# Patient Record
Sex: Male | Born: 1960 | Race: White | Hispanic: No | Marital: Single | Smoking: Former smoker
Health system: Southern US, Community
[De-identification: ages and names within clinical notes are randomized; demographics above are authoritative.]

## PROBLEM LIST (undated history)

## (undated) DIAGNOSIS — K7460 Unspecified cirrhosis of liver: Secondary | ICD-10-CM

## (undated) DIAGNOSIS — B1920 Unspecified viral hepatitis C without hepatic coma: Secondary | ICD-10-CM

## (undated) DIAGNOSIS — J45909 Unspecified asthma, uncomplicated: Secondary | ICD-10-CM

## (undated) DIAGNOSIS — F419 Anxiety disorder, unspecified: Secondary | ICD-10-CM

## (undated) DIAGNOSIS — L409 Psoriasis, unspecified: Secondary | ICD-10-CM

## (undated) HISTORY — PX: LIVER BIOPSY: SHX301

## (undated) HISTORY — DX: Anxiety disorder, unspecified: F41.9

## (undated) HISTORY — DX: Unspecified cirrhosis of liver: K74.60

## (undated) HISTORY — DX: Psoriasis, unspecified: L40.9

## (undated) HISTORY — DX: Unspecified asthma, uncomplicated: J45.909

## (undated) HISTORY — DX: Unspecified viral hepatitis C without hepatic coma: B19.20

---

## 2001-09-04 HISTORY — PX: ANKLE SURGERY: SHX546

## 2007-09-13 ENCOUNTER — Encounter (INDEPENDENT_AMBULATORY_CARE_PROVIDER_SITE_OTHER): Payer: Self-pay | Admitting: Interventional Radiology

## 2007-09-13 ENCOUNTER — Ambulatory Visit (HOSPITAL_COMMUNITY): Admission: RE | Admit: 2007-09-13 | Discharge: 2007-09-13 | Payer: Self-pay | Admitting: Internal Medicine

## 2008-12-17 IMAGING — XA IR TRANSCATHETER BIOPSY
1 series · 3 of 3 positions shown · non-contrast
Comparison: none

CLINICAL DATA: Hepatitis C and low platelet count.
 ULTRASOUND GUIDANCE FOR VASCULAR ACCESS:
 FLUOROSCOPIC GUIDANCE FOR TRANSJUGULAR LIVER BIOPSY:
 FLUOROSCOPIC GUIDANCE FOR HEPATIC VENOGRAM:
 Procedure:  The right neck was prepped and draped in a sterile fashion and Lidocaine was utilized for local anesthesia. Under sonographic guidance, a micropuncture needle was inserted into the right IJ vein which was noted to be patent.  It was removed over an 018 wire which was up sized to Harola Kreatif. A 10 French sheath was advanced over the Biuro Detektywistyczne into the IVC.  A 5 French MPA catheter was advanced over the Bentson wire into the right hepatic vein.  Right hepatic venography was performed.  The MPA was removed over an Amplatz.  The 10 French sheath was advanced over the Amplatz into the right hepatic vein. The 7 French stiff sheath was advanced through the 10 French sheath into the right hepatic vein. Several passes with a core biopsy device were made.  The 7 and 10 French sheaths were removed and hemostasis was achieved with direct pressure with no complications.

[Series 1: run · 3 of 3 slices shown]
[im 1/3]
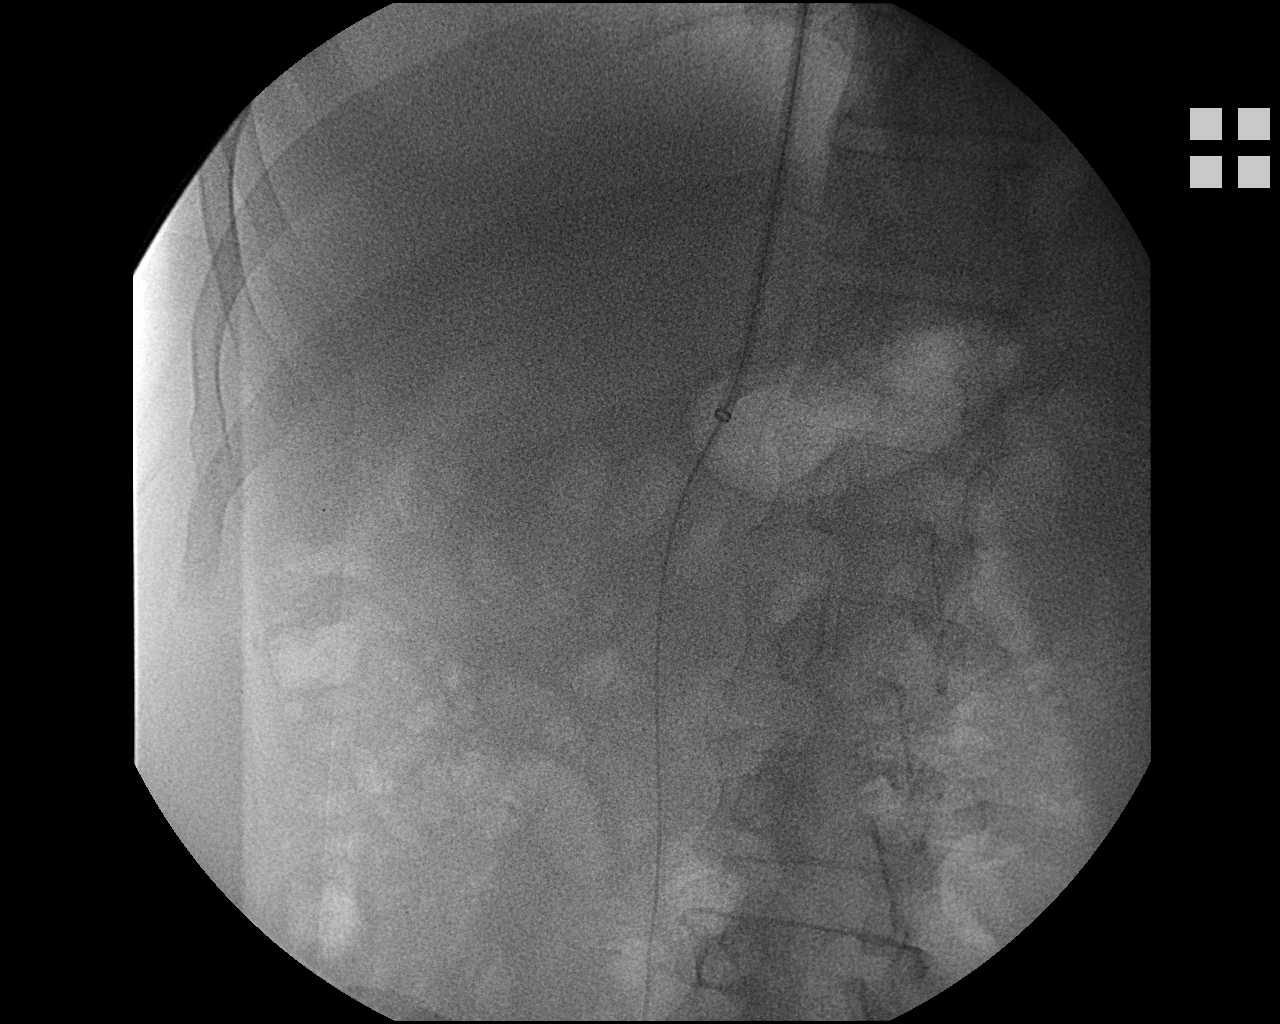
[im 2/3]
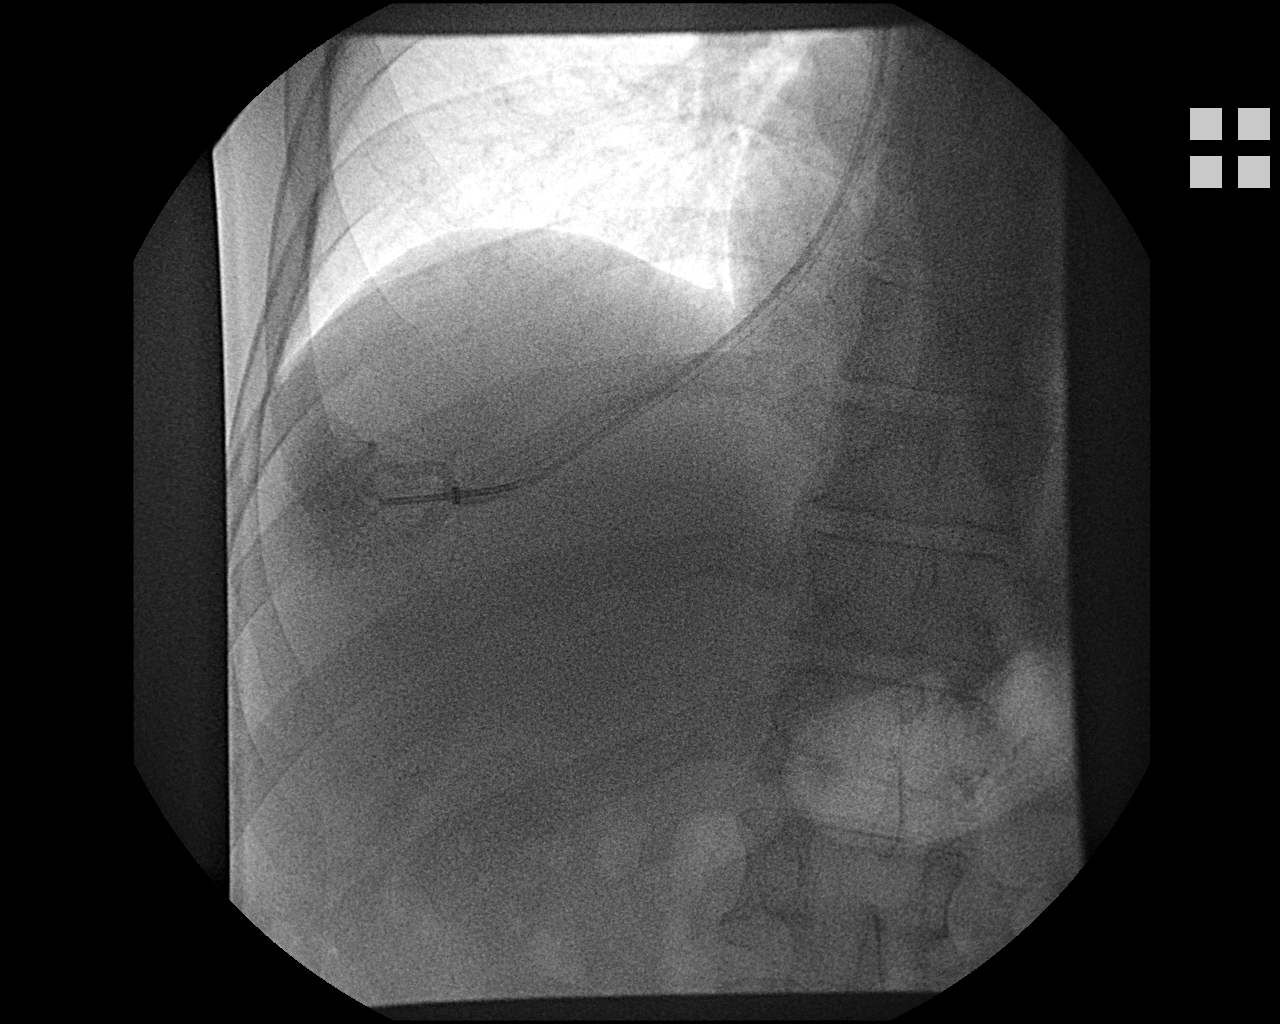
[im 3/3]
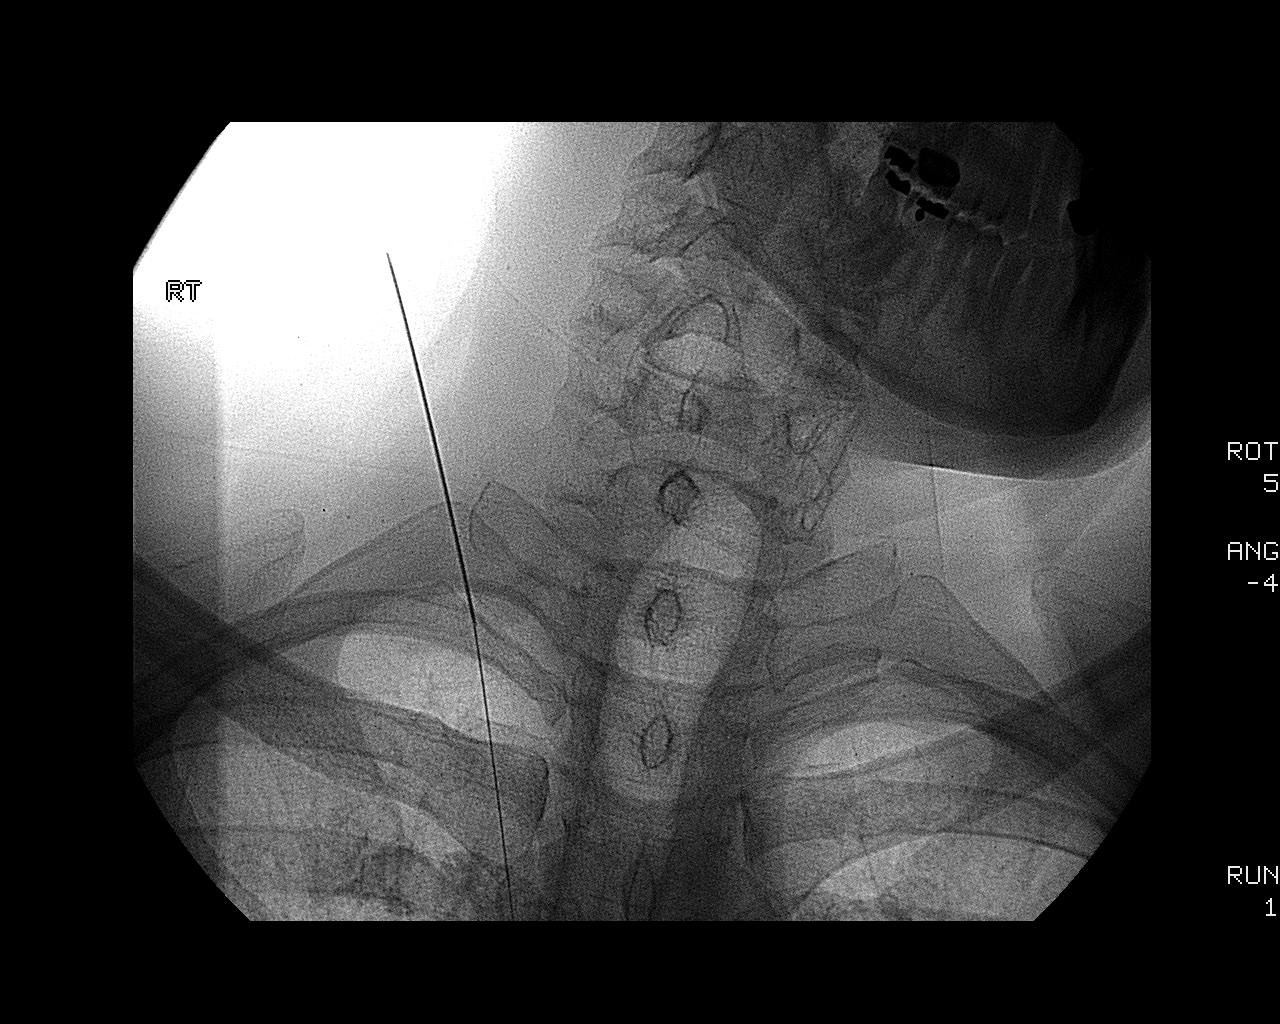

[3 of 3 positions shown; findings below may reference images not displayed]

FINDINGS: Hepatic venography demonstrates wide patency of the right hepatic vein.
IMPRESSION: 1.  Right hepatic vein is patent.
 2.  Transjugular liver core biopsy.

## 2011-05-25 LAB — BASIC METABOLIC PANEL
CO2: 29
Calcium: 9.5
GFR calc non Af Amer: >60
Glucose, Bld: 93
Potassium: 4.2
Sodium: 140

## 2011-05-25 LAB — CBC
Hemoglobin: 16.1
MCV: 91.6
RBC: 5.16
WBC: 6.6

## 2011-05-25 LAB — PROTIME-INR
INR: 1.1
Prothrombin Time: 14.1

## 2012-03-04 ENCOUNTER — Ambulatory Visit (INDEPENDENT_AMBULATORY_CARE_PROVIDER_SITE_OTHER): Payer: BC Managed Care – PPO | Admitting: Internal Medicine

## 2012-03-04 ENCOUNTER — Encounter (INDEPENDENT_AMBULATORY_CARE_PROVIDER_SITE_OTHER): Payer: Self-pay | Admitting: Internal Medicine

## 2012-03-04 VITALS — BP 118/78 | HR 80 | Temp 97.8°F | Resp 18 | Ht 69.0 in | Wt 178.9 lb

## 2012-03-04 DIAGNOSIS — L408 Other psoriasis: Secondary | ICD-10-CM

## 2012-03-04 DIAGNOSIS — L409 Psoriasis, unspecified: Secondary | ICD-10-CM | POA: Insufficient documentation

## 2012-03-04 DIAGNOSIS — B182 Chronic viral hepatitis C: Secondary | ICD-10-CM | POA: Insufficient documentation

## 2012-03-04 DIAGNOSIS — D6959 Other secondary thrombocytopenia: Secondary | ICD-10-CM

## 2012-03-04 DIAGNOSIS — K7460 Unspecified cirrhosis of liver: Secondary | ICD-10-CM

## 2012-03-04 NOTE — Patient Instructions (Signed)
Physician will contact you with results of blood work and ultrasound and further recommendations.

## 2012-03-04 NOTE — Progress Notes (Signed)
Presenting complaint;  Follow for chronic hepatitis C.  History of present illness;  Grade this 51 year old Caucasian male who has biopsy proven chronic hepatitis C. He underwent liver biopsy in January 2009 revealing stage III fibrosis. He received about 60 weeks of therapy with Pegasys and ribavirin. Viral count was negative at [facility] but he did not have SVR. Patient relocated to [place] and I have not seen him over 2 years. He states he did not drink alcohol for one more year but then in June 2012 he began to drink alcohol primarily beer. He realizes he was drinking excessive amounts. Several weeks ago he noted change in the color of his urine. Realizing that it was damaging his liver he quit drinking alcohol about a month ago. He denies abdominal pain nausea vomiting weakness or fatigue. He has good appetite. His weight has been stable. His bowels move daily. He denies melena or rectal bleeding. He has not have any problems with lower extremity edema.  Current Medications: No current outpatient prescriptions on file.  Past medical history; Psoriasis. Chronic hepatitis C stage III disease; liver biopsy January, 2009. He has been vaccinated for hepatitis A and B. History of asthma. Has not required any treatment for several years. Surgery for left ankle fracture in 2002. Allergies; NKA. Family history; Father drank too much alcohol and died at age 52 also be secondary to alcoholic hepatitis. Mother is in good health. Patient has one sister and half-sister and they're both in good health. Social history; He is never married and does not have any children. 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 He does not smoke cigarettes but dips snuff. He's been drinking beer for over 20 years. He quit for about 3 years and he was treated for hep C but started one year ago and quit again in one month ago.  Objective: Blood pressure 118/78, pulse 80, temperature 97.8 F  (36.6 C), temperature source Oral, resp. rate 18, height 5\' 9"  (1.753 m), weight 178 lb 14.4 oz (81.149 kg). Well-developed well-nourished very anxious Caucasian male who is in no acute distress. Asterixis absent. Conjunctiva is pink. Sclera is nonicteric Oropharyngeal mucosa is normal. No neck masses or thyromegaly noted. He has few spider angiomata over upper anterior chest Cardiac exam with regular rhythm normal S1 and S2. No murmur or gallop noted. Lungs are clear to auscultation. Abdomen is symmetrical. He has erythematous scaly rash around the umbilicus. Abdomen is soft and easily palpable spleen. Liver edge is indistinct span is 13-14 cm. Shifting dullness is absent. No LE edema or clubbing noted. He has a tremendous scaly rash over both legs but more so involving the right mid leg.    Assessment:  Chronic hepatitis C genotype 1 stage III status post antiviral therapy for 60 weeks ending in early part of 2010 without SVR. Clinically he does not have ascites. His disease needs to be restaged and determination made whether he can be retreated with 3 drugs. Chronic thrombocytopenia may determine whether or not he can be retreated with interferon-based therapy.   Plan:  Patient must abstain from drinking alcohol in order to improve his prognosis. Hepatobiliary ultrasound. CBC, comprehensive chemistry panel, INR and alpha-fetoprotein. Further recommendations to follow.

## 2012-03-05 ENCOUNTER — Encounter (INDEPENDENT_AMBULATORY_CARE_PROVIDER_SITE_OTHER): Payer: Self-pay | Admitting: *Deleted

## 2012-03-05 LAB — PROTIME-INR
INR: 1.1 (ref <1.50)
Prothrombin Time: 14.6 seconds (ref 11.6–15.2)

## 2012-03-05 LAB — COMPREHENSIVE METABOLIC PANEL
Albumin: 3.9 g/dL (ref 3.5–5.2)
BUN: 12 mg/dL (ref 6–23)
CO2: 25 mEq/L (ref 19–32)
Calcium: 9.2 mg/dL (ref 8.4–10.5)
Glucose, Bld: 73 mg/dL (ref 70–99)
Potassium: 3.6 mEq/L (ref 3.5–5.3)
Sodium: 139 mEq/L (ref 135–145)
Total Protein: 6.6 g/dL (ref 6.0–8.3)

## 2012-03-05 LAB — AFP TUMOR MARKER: AFP-Tumor Marker: 6.7 ng/mL (ref 0.0–8.0)

## 2012-03-05 LAB — CBC
HCT: 44.2 % (ref 39.0–52.0)
Hemoglobin: 15.7 g/dL (ref 13.0–17.0)
RBC: 4.84 MIL/uL (ref 4.22–5.81)

## 2012-03-05 NOTE — Telephone Encounter (Signed)
This encounter was created in error - please disregard.

## 2012-03-08 ENCOUNTER — Ambulatory Visit (HOSPITAL_COMMUNITY)
Admission: RE | Admit: 2012-03-08 | Discharge: 2012-03-08 | Disposition: A | Discharge status: Home / Self Care | Payer: BC Managed Care – PPO | Source: Ambulatory Visit | Attending: Internal Medicine | Admitting: Internal Medicine

## 2012-03-08 DIAGNOSIS — B182 Chronic viral hepatitis C: Secondary | ICD-10-CM | POA: Insufficient documentation

## 2012-03-08 DIAGNOSIS — R161 Splenomegaly, not elsewhere classified: Secondary | ICD-10-CM | POA: Insufficient documentation

## 2012-03-08 DIAGNOSIS — K7460 Unspecified cirrhosis of liver: Secondary | ICD-10-CM | POA: Insufficient documentation

## 2012-03-11 DIAGNOSIS — D6959 Other secondary thrombocytopenia: Secondary | ICD-10-CM | POA: Insufficient documentation

## 2012-03-14 ENCOUNTER — Telehealth (INDEPENDENT_AMBULATORY_CARE_PROVIDER_SITE_OTHER): Payer: Self-pay | Admitting: *Deleted

## 2012-03-14 DIAGNOSIS — B1920 Unspecified viral hepatitis C without hepatic coma: Secondary | ICD-10-CM

## 2012-03-14 NOTE — Telephone Encounter (Signed)
Per Dr.Rehman the patient will need to have labs in 6 months prior to his office visit. Labs are noted for 09-13-2012

## 2012-08-15 ENCOUNTER — Encounter (INDEPENDENT_AMBULATORY_CARE_PROVIDER_SITE_OTHER): Payer: Self-pay | Admitting: *Deleted

## 2012-08-15 ENCOUNTER — Telehealth (INDEPENDENT_AMBULATORY_CARE_PROVIDER_SITE_OTHER): Payer: Self-pay | Admitting: *Deleted

## 2012-08-15 DIAGNOSIS — B1920 Unspecified viral hepatitis C without hepatic coma: Secondary | ICD-10-CM

## 2012-08-15 NOTE — Telephone Encounter (Signed)
Lab order done 

## 2012-08-22 ENCOUNTER — Telehealth (INDEPENDENT_AMBULATORY_CARE_PROVIDER_SITE_OTHER): Payer: Self-pay | Admitting: *Deleted

## 2012-08-22 DIAGNOSIS — B1920 Unspecified viral hepatitis C without hepatic coma: Secondary | ICD-10-CM

## 2012-08-22 NOTE — Telephone Encounter (Signed)
Per Dr.Rehman we may add the ammonia level

## 2012-09-02 ENCOUNTER — Other Ambulatory Visit (INDEPENDENT_AMBULATORY_CARE_PROVIDER_SITE_OTHER): Payer: Self-pay | Admitting: Internal Medicine

## 2012-09-02 LAB — COMPREHENSIVE METABOLIC PANEL
Albumin: 4.4 g/dL (ref 3.5–5.2)
Alkaline Phosphatase: 91 U/L (ref 39–117)
BUN: 11 mg/dL (ref 6–23)
Creat: 0.87 mg/dL (ref 0.50–1.35)
Glucose, Bld: 82 mg/dL (ref 70–99)
Total Bilirubin: 1.9 mg/dL — ABNORMAL HIGH (ref 0.3–1.2)

## 2012-09-03 LAB — CBC WITH DIFFERENTIAL/PLATELET
Eosinophils Relative: 2 % (ref 0–5)
HCT: 46.2 % (ref 39.0–52.0)
Hemoglobin: 16 g/dL (ref 13.0–17.0)
Lymphocytes Relative: 31 % (ref 12–46)
Lymphs Abs: 1.2 10*3/uL (ref 0.7–4.0)
MCV: 88.2 fL (ref 78.0–100.0)
Monocytes Relative: 10 % (ref 3–12)
Platelets: 69 10*3/uL — ABNORMAL LOW (ref 150–400)
RBC: 5.24 MIL/uL (ref 4.22–5.81)
WBC: 4.3 10*3/uL (ref 4.0–10.5)

## 2012-09-17 ENCOUNTER — Ambulatory Visit (INDEPENDENT_AMBULATORY_CARE_PROVIDER_SITE_OTHER): Payer: BC Managed Care – PPO | Admitting: Internal Medicine

## 2012-11-25 ENCOUNTER — Ambulatory Visit (INDEPENDENT_AMBULATORY_CARE_PROVIDER_SITE_OTHER): Payer: BC Managed Care – PPO | Admitting: Internal Medicine

## 2013-01-14 ENCOUNTER — Ambulatory Visit (INDEPENDENT_AMBULATORY_CARE_PROVIDER_SITE_OTHER): Payer: BC Managed Care – PPO | Admitting: Internal Medicine

## 2013-01-14 ENCOUNTER — Encounter (INDEPENDENT_AMBULATORY_CARE_PROVIDER_SITE_OTHER): Payer: Self-pay | Admitting: Internal Medicine

## 2013-01-14 VITALS — BP 116/76 | HR 80 | Temp 97.4°F | Resp 18 | Ht 69.0 in | Wt 173.9 lb

## 2013-01-14 DIAGNOSIS — K7460 Unspecified cirrhosis of liver: Secondary | ICD-10-CM

## 2013-01-14 DIAGNOSIS — G4700 Insomnia, unspecified: Secondary | ICD-10-CM | POA: Insufficient documentation

## 2013-01-14 DIAGNOSIS — B182 Chronic viral hepatitis C: Secondary | ICD-10-CM

## 2013-01-14 MED ORDER — LORAZEPAM 1 MG PO TABS: 1.0000 mg | ORAL_TABLET | Freq: Every day | ORAL | Status: DC | PRN
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
## 2013-01-14 NOTE — Patient Instructions (Signed)
Can take lorazepam 1 mg by mouth at bedtime as needed. Physician will contact her with results of blood work

## 2013-01-15 LAB — CBC
HCT: 46.4 % (ref 39.0–52.0)
Hemoglobin: 16.3 g/dL (ref 13.0–17.0)
MCHC: 35.1 g/dL (ref 30.0–36.0)
WBC: 5.2 10*3/uL (ref 4.0–10.5)

## 2013-01-15 LAB — COMPREHENSIVE METABOLIC PANEL
Albumin: 4.4 g/dL (ref 3.5–5.2)
BUN: 14 mg/dL (ref 6–23)
Calcium: 9.8 mg/dL (ref 8.4–10.5)
Chloride: 105 mEq/L (ref 96–112)
Glucose, Bld: 77 mg/dL (ref 70–99)
Potassium: 3.7 mEq/L (ref 3.5–5.3)

## 2013-01-15 LAB — PROTIME-INR: INR: 1.16 (ref <1.50)

## 2013-01-15 LAB — AFP TUMOR MARKER: AFP-Tumor Marker: 5.8 ng/mL (ref 0.0–8.0)

## 2013-04-15 ENCOUNTER — Encounter (INDEPENDENT_AMBULATORY_CARE_PROVIDER_SITE_OTHER): Payer: Self-pay | Admitting: Internal Medicine

## 2013-04-15 ENCOUNTER — Ambulatory Visit (INDEPENDENT_AMBULATORY_CARE_PROVIDER_SITE_OTHER): Payer: BC Managed Care – PPO | Admitting: Internal Medicine

## 2013-04-15 VITALS — BP 114/68 | HR 74 | Temp 99.4°F | Resp 20 | Ht 69.0 in | Wt 175.9 lb

## 2013-04-15 DIAGNOSIS — G4700 Insomnia, unspecified: Secondary | ICD-10-CM

## 2013-04-15 DIAGNOSIS — K7460 Unspecified cirrhosis of liver: Secondary | ICD-10-CM

## 2013-04-15 DIAGNOSIS — B182 Chronic viral hepatitis C: Secondary | ICD-10-CM

## 2013-04-15 MED ORDER — LORAZEPAM 1 MG PO TABS: 1.5000 mg | ORAL_TABLET | Freq: Every day | ORAL | Status: DC | PRN

## 2013-04-15 NOTE — Progress Notes (Signed)
Presenting complaint;  Followup for hepatitis C and insomnia.  Subjective:  Patient is 52 year old Caucasian male who has chronic hepatitis C with cirrhosis who presents for scheduled visit. He was last seen about 3 months ago. He states he he has not had an alcohol since his last visit. 1 mg of trazodone does not help at one and a half milligrams does help him sleep. His appetite is fair. His weight has been stable. He is not have any problems with abdominal distention or fluid retention in his lower extremities. He denies melena or rectal bleeding. He is interested in having screening colonoscopy.  Current Medications: Current Outpatient Prescriptions  Medication Sig Dispense Refill  . LORazepam (ATIVAN) 1 MG tablet Take 1 tablet (1 mg total) by mouth daily as needed for anxiety.  30 tablet  1   No current facility-administered medications for this visit.     Objective: Blood pressure 114/68, pulse 74, temperature 99.4 F (37.4 C), temperature source Oral, resp. rate 20, height 5\' 9"  (1.753 m), weight 175 lb 14.4 oz (79.788 kg). Patient is alert and does not have asterixis. Conjunctiva is pink. Sclera is nonicteric Oropharyngeal mucosa is normal. No neck masses or thyromegaly noted. Cardiac exam with regular rhythm normal S1 and S2. No murmur or gallop noted. Lungs are clear to auscultation. Abdomen abdomen is soft with easily palpable spleen. Liver edge is also palpable below RCM margin is firm and nontender.  No LE edema or clubbing noted.  Labs/studies Results: Records from [facility] reviewed. He had abdominopelvic CT as well as chest CT on 06/08/2012. He has splenomegaly no focal abnormalities to liver. Lab studies from 01/14/2013 WBC 5.2, H&H 16.3 and 46.4 and platelet count 64K AFP 5.8 INR 1.16 Electrolytes within normal limits. Glucose 77, BUN 14, creatinine 0.84 Bilirubin 1.2 AP 104 AST 112 and ALT 119 Albumin 4.4,      Assessment:  #1.  Chronic hepatitis C with cirrhosis. He has failed treatment with Pegasys and ribavirin. He would be candidate for retreatment of newer agents before too long. He must continue to abstain from drinking alcohol. #2. Insomnia. .   Plan:  Ultrasound for HCC screening. Screening colonoscopy to be scheduled. Lorazepam 1.5 mg by mouth each bedtime. Office visit in 6 months or earlier depending on availability of newer agents to treat hepatitis C.

## 2013-04-15 NOTE — Patient Instructions (Signed)
-  Screening colonoscopy to be scheduled

## 2013-04-18 ENCOUNTER — Ambulatory Visit (HOSPITAL_COMMUNITY): Payer: BC Managed Care – PPO

## 2013-04-24 ENCOUNTER — Encounter (INDEPENDENT_AMBULATORY_CARE_PROVIDER_SITE_OTHER): Payer: Self-pay

## 2013-06-12 IMAGING — US US ABDOMEN COMPLETE
1 series · 14 of 25 positions shown · non-contrast
Comparison: Ultrasound dated 04/22/2008

CLINICAL DATA: Hepatitis C.

COMPLETE ABDOMINAL ULTRASOUND

[Series 1: us abdomen complete · 0.25mm/px · 14 of 93 slices shown]
[im 1/93]
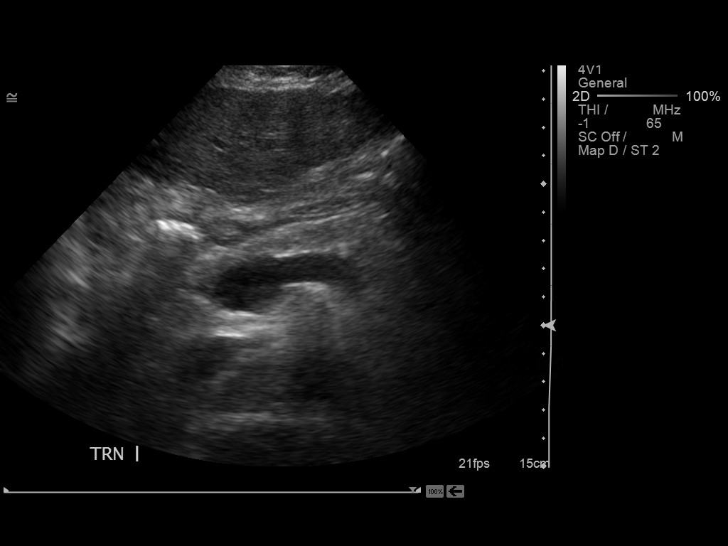
[im 8/93]
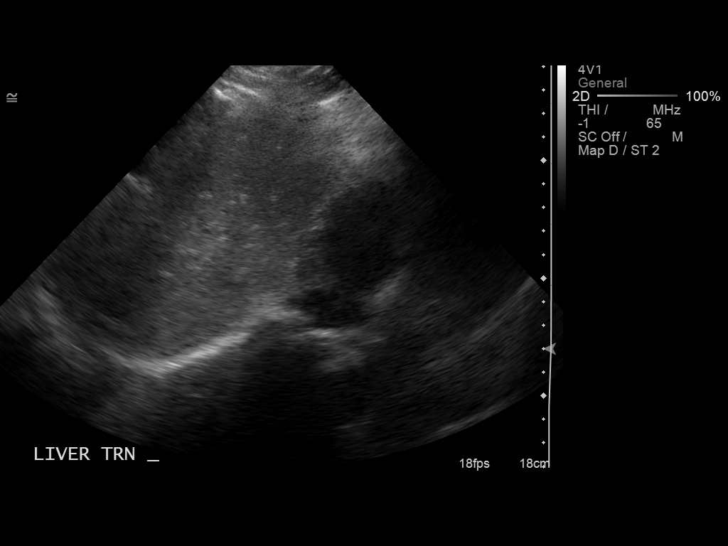
[im 16/93]
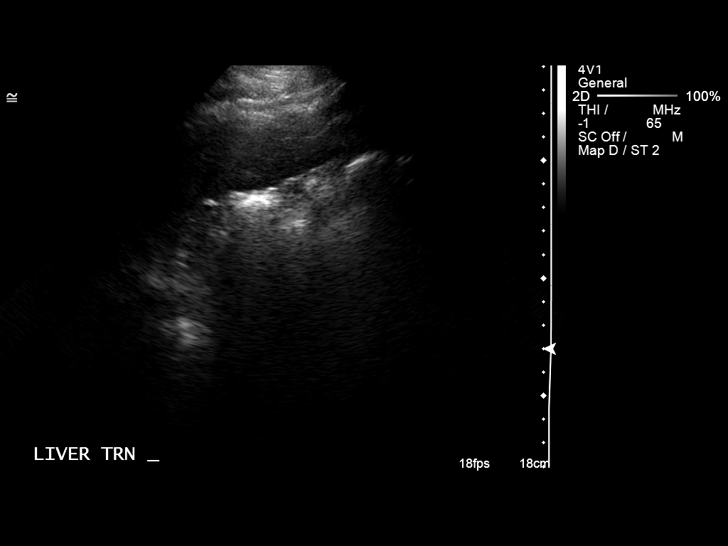
[im 24/93]
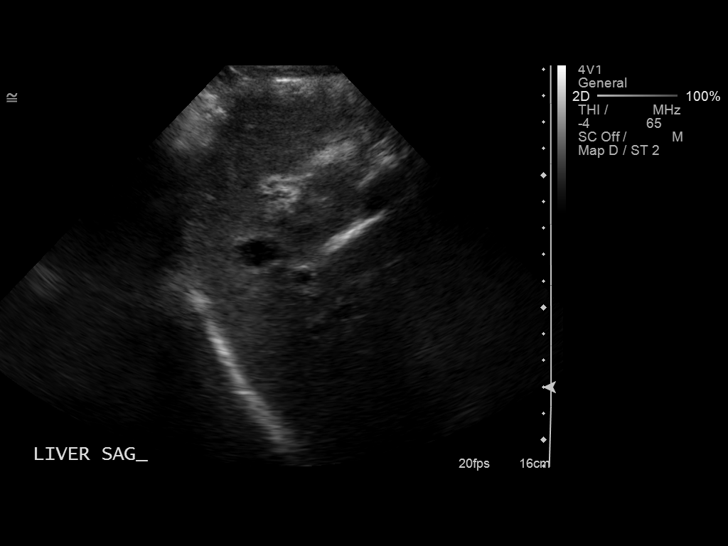
[im 31/93]
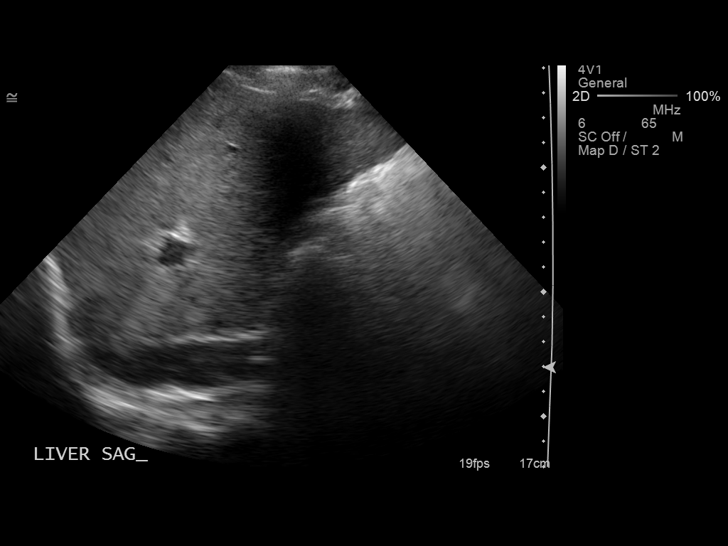
[im 35/93]
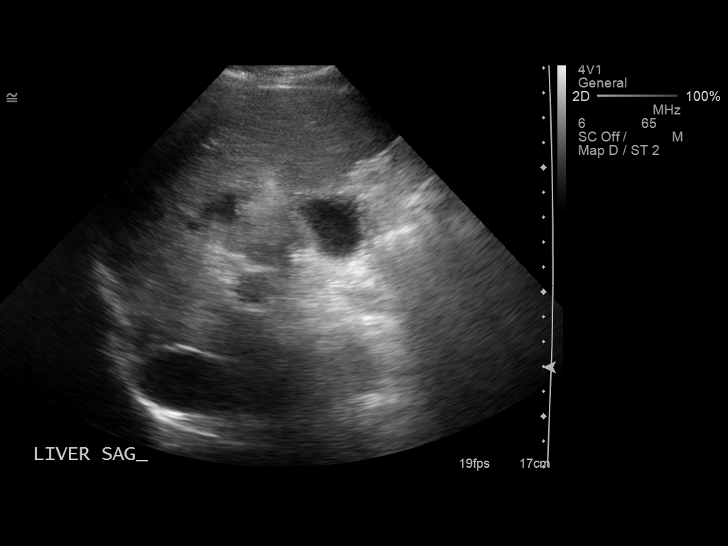
[im 43/93]
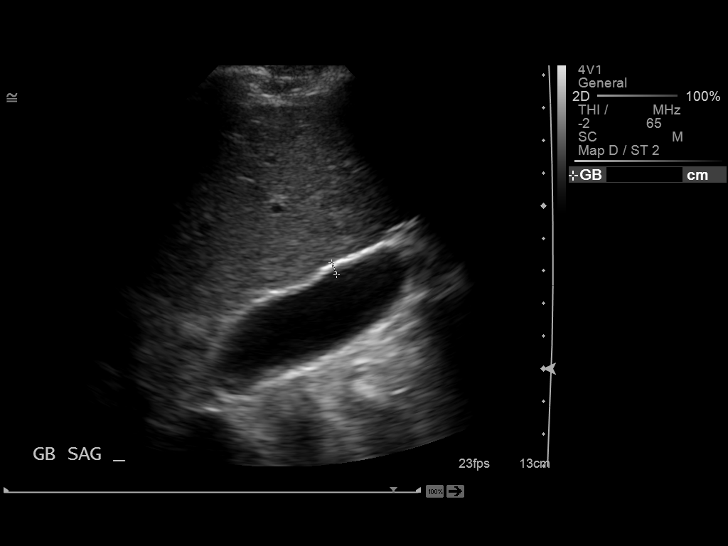
[im 50/93]
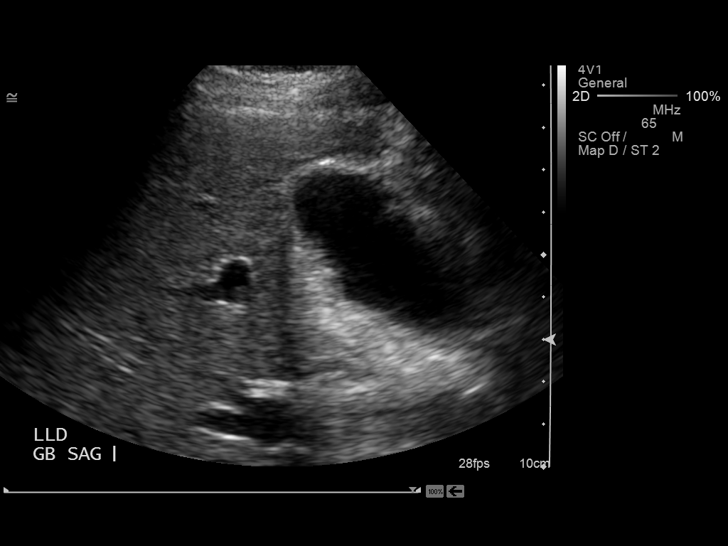
[im 58/93]
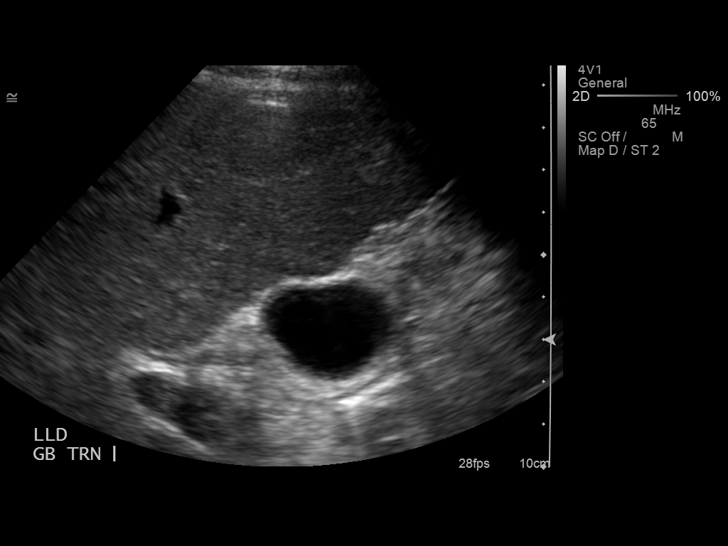
[im 62/93]
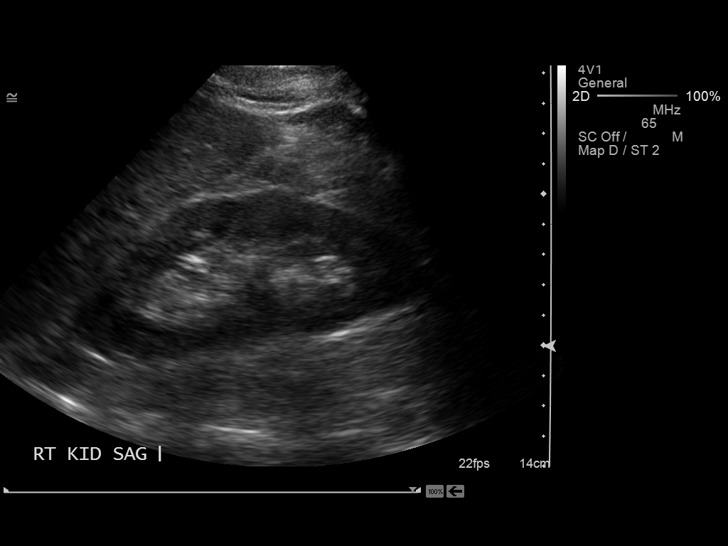
[im 70/93]
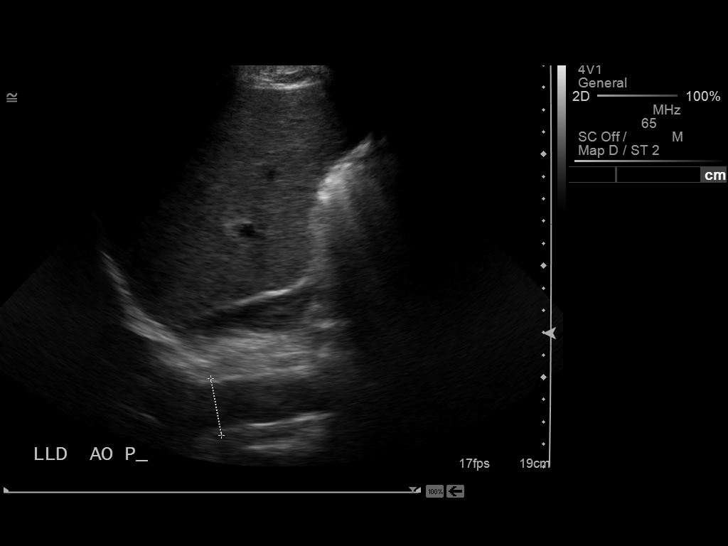
[im 77/93]
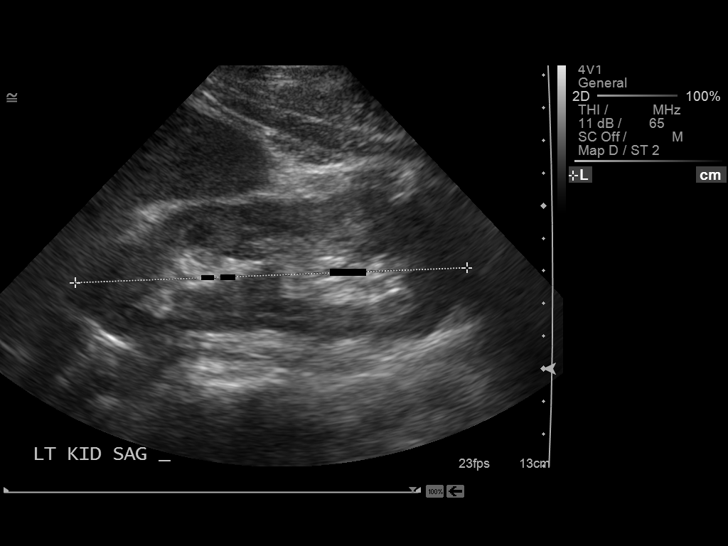
[im 85/93]
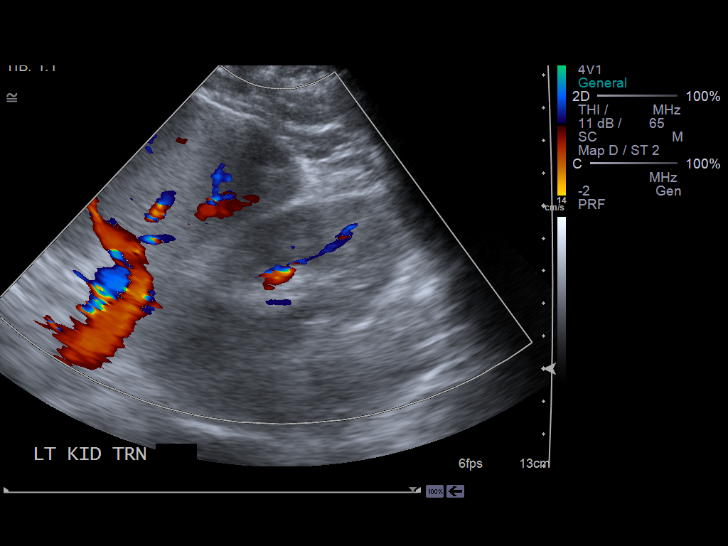
[im 93/93]
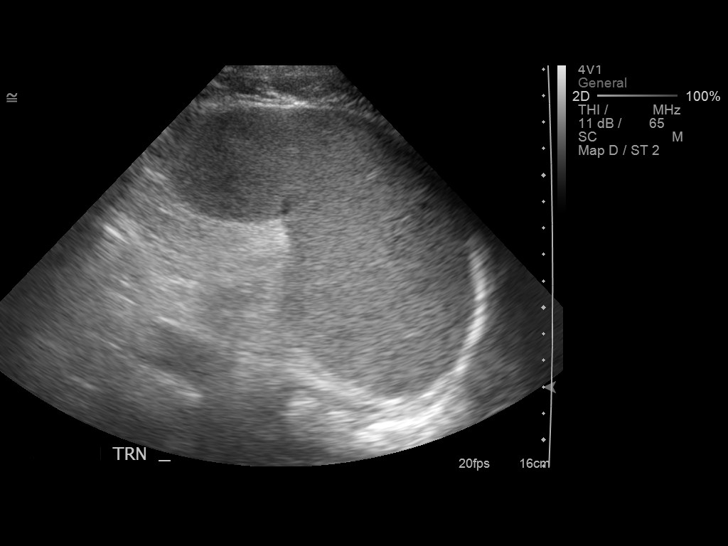

[14 of 25 positions shown; findings below may reference images not displayed]

FINDINGS: Gallbladder:  No gallstones, gallbladder wall thickening, or
pericholecystic fluid. Negative sonographic Murphy's sign.

Common bile duct:  Normal.  5 mm in diameter.

Liver:  No focal lesions in the liver.  Echotexture is slightly
coarse, nonspecific.

IVC:  Normal.

Pancreas:  Normal.

Spleen:  Splenomegaly.  Length is 10.9 cm. Volume is 485 cc.

Right Kidney:  Normal.  11.9 cm in length.

Left Kidney:  Normal.  12.0 cm in length.

Abdominal aorta:  Normal.  2.6 cm maximal diameter.
IMPRESSION: Slight splenomegaly.  No significant abnormality of the liver
parenchyma.

## 2013-08-20 ENCOUNTER — Other Ambulatory Visit (INDEPENDENT_AMBULATORY_CARE_PROVIDER_SITE_OTHER): Payer: Self-pay | Admitting: Internal Medicine

## 2013-10-21 ENCOUNTER — Ambulatory Visit (INDEPENDENT_AMBULATORY_CARE_PROVIDER_SITE_OTHER): Payer: BC Managed Care – PPO | Admitting: Internal Medicine

## 2013-11-26 ENCOUNTER — Other Ambulatory Visit (INDEPENDENT_AMBULATORY_CARE_PROVIDER_SITE_OTHER): Payer: Self-pay | Admitting: Internal Medicine

## 2013-11-26 ENCOUNTER — Ambulatory Visit (INDEPENDENT_AMBULATORY_CARE_PROVIDER_SITE_OTHER): Payer: BC Managed Care – PPO | Admitting: Internal Medicine

## 2013-11-26 ENCOUNTER — Encounter (INDEPENDENT_AMBULATORY_CARE_PROVIDER_SITE_OTHER): Payer: Self-pay | Admitting: Internal Medicine

## 2013-11-26 VITALS — BP 110/70 | HR 76 | Temp 98.3°F | Resp 20 | Ht 69.0 in | Wt 180.9 lb

## 2013-11-26 DIAGNOSIS — G4700 Insomnia, unspecified: Secondary | ICD-10-CM

## 2013-11-26 DIAGNOSIS — K7460 Unspecified cirrhosis of liver: Secondary | ICD-10-CM

## 2013-11-26 DIAGNOSIS — B182 Chronic viral hepatitis C: Secondary | ICD-10-CM

## 2013-11-26 LAB — CBC
HEMATOCRIT: 45.1 % (ref 39.0–52.0)
HEMOGLOBIN: 16.1 g/dL (ref 13.0–17.0)
MCH: 31.8 pg (ref 26.0–34.0)
MCHC: 35.7 g/dL (ref 30.0–36.0)
MCV: 89.1 fL (ref 78.0–100.0)
Platelets: 55 10*3/uL — ABNORMAL LOW (ref 150–400)
RBC: 5.06 MIL/uL (ref 4.22–5.81)
RDW: 13.1 % (ref 11.5–15.5)
WBC: 4.2 10*3/uL (ref 4.0–10.5)

## 2013-11-26 MED ORDER — LORAZEPAM 1 MG PO TABS: ORAL_TABLET | ORAL | Status: DC

## 2013-11-26 NOTE — Progress Notes (Signed)
Presenting complaint;  Follow for chronic hepatitis C.  Subjective:  Patient is 53 year old Caucasian male who has chronic hepatitis C genotype 1A and insomnia was here for scheduled visit. He was last seen in August 2014. He states he has not had any alcohol in almost one year. He feels well. He has good appetite. He skin 5 pounds since last visit. He denies abdominal pain melena or rectal bleeding. On his last visit he was advised to undergo screening colonoscopy but he has wandered away. He was also advised ultrasound primarily for screening but his co-pay was too large and he was unable to afford it. He is anxious to be retreated. He would like to get new prescription for lorazepam which she takes for insomnia and he states current dose is helping him.   Current Medications: Outpatient Encounter Prescriptions as of 11/26/2013  Medication Sig  . LORazepam (ATIVAN) 1 MG tablet TAKE 1 1/2 TABLETS BY MOUTH EVERY DAY AS NEEDED FOR ANXIETY     Objective: Blood pressure 110/70, pulse 76, temperature 98.3 F (36.8 C), temperature source Oral, resp. rate 20, height 5\' 9"  (1.753 m), weight 180 lb 14.4 oz (82.056 kg). Patient is alert and in no acute distress. He does not have asterixis. Conjunctiva is pink. Sclera is nonicteric Oropharyngeal mucosa is normal. No neck masses or thyromegaly noted. Cardiac exam with regular rhythm normal S1 and S2. No murmur or gallop noted. Lungs are clear to auscultation. Abdomen symmetrical and soft without tenderness. Spleen tip is palpable. Liver edge is also palpable below right costal margin. It is somewhat firm but not tender.  He has skin rash involving skin at the umbilicus. No LE edema or clubbing noted.    Assessment:  #1. Chronic hepatitis C genotype 1a. He had a liver biopsy in January 2009 revealed stage III disease. He failed therapy with Pegasys and [place]. 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 Will try  to document this with elastography. #2. Chronic insomnia. He is getting some relief with lorazepam.  Plan;  Patient will go to lab for CBC, comprehensive chemistry panel and AFP. Will also obtain HCVRNA by PCR quantitative. Upper abdominal ultrasound along with elastography. As soon as re-evaluation is complete will send request to his insurance. New prescription given for lorazepam 1 mg tablet 45 pills with 2 refills. Office visit in 3 months. Will schedule screening colonoscopy when he is ready.

## 2013-11-26 NOTE — Patient Instructions (Signed)
Physician will contact you with results of ultrasound and blood work

## 2013-11-27 ENCOUNTER — Telehealth (INDEPENDENT_AMBULATORY_CARE_PROVIDER_SITE_OTHER): Payer: Self-pay | Admitting: *Deleted

## 2013-11-27 DIAGNOSIS — B1920 Unspecified viral hepatitis C without hepatic coma: Secondary | ICD-10-CM

## 2013-11-27 LAB — COMPREHENSIVE METABOLIC PANEL
ALBUMIN: 3.9 g/dL (ref 3.5–5.2)
ALT: 122 U/L — ABNORMAL HIGH (ref 0–53)
AST: 107 U/L — AB (ref 0–37)
Alkaline Phosphatase: 96 U/L (ref 39–117)
BUN: 11 mg/dL (ref 6–23)
CALCIUM: 9.3 mg/dL (ref 8.4–10.5)
CHLORIDE: 104 meq/L (ref 96–112)
CO2: 27 meq/L (ref 19–32)
CREATININE: 0.95 mg/dL (ref 0.50–1.35)
GLUCOSE: 75 mg/dL (ref 70–99)
POTASSIUM: 4.4 meq/L (ref 3.5–5.3)
Sodium: 137 mEq/L (ref 135–145)
Total Bilirubin: 1.4 mg/dL — ABNORMAL HIGH (ref 0.2–1.2)
Total Protein: 6.8 g/dL (ref 6.0–8.3)

## 2013-11-27 LAB — HEPATITIS C RNA QUANTITATIVE
HCV Quantitative Log: 5.26 {Log} — ABNORMAL HIGH (ref <1.18)
HCV Quantitative: 182553 IU/mL — ABNORMAL HIGH (ref <15)

## 2013-11-27 LAB — AFP TUMOR MARKER: AFP TUMOR MARKER: 7 ng/mL (ref 0.0–8.0)

## 2013-11-27 NOTE — Telephone Encounter (Signed)
Per Dr.Rehman the patient will need to have labs drawn. 

## 2013-12-01 ENCOUNTER — Ambulatory Visit (HOSPITAL_COMMUNITY)
Admission: RE | Admit: 2013-12-01 | Discharge: 2013-12-01 | Disposition: A | Discharge status: Home / Self Care | Payer: BC Managed Care – PPO | Source: Ambulatory Visit | Attending: Internal Medicine | Admitting: Internal Medicine

## 2013-12-01 DIAGNOSIS — R161 Splenomegaly, not elsewhere classified: Secondary | ICD-10-CM | POA: Insufficient documentation

## 2013-12-01 DIAGNOSIS — B182 Chronic viral hepatitis C: Secondary | ICD-10-CM

## 2013-12-01 DIAGNOSIS — B1920 Unspecified viral hepatitis C without hepatic coma: Secondary | ICD-10-CM | POA: Insufficient documentation

## 2013-12-01 DIAGNOSIS — K7460 Unspecified cirrhosis of liver: Secondary | ICD-10-CM | POA: Insufficient documentation

## 2014-02-09 ENCOUNTER — Telehealth (INDEPENDENT_AMBULATORY_CARE_PROVIDER_SITE_OTHER): Payer: Self-pay | Admitting: *Deleted

## 2014-02-09 NOTE — Telephone Encounter (Signed)
Message left on voice mail Friday 12:33  Calling to check on medication for his treatment  Please call her back   [phone]

## 2014-02-10 ENCOUNTER — Encounter (INDEPENDENT_AMBULATORY_CARE_PROVIDER_SITE_OTHER): Payer: Self-pay

## 2014-02-11 NOTE — Telephone Encounter (Signed)
I have called the patient 's mother after rec'ving a letter from the patient giving [place] permission to do so. Prior to calling her, I called [facility]. They informed me today that they had not recd' anything from us,and that the number I had faxed, and recd' a successful transmittal, they are having problems with that particular fax line. This was all sent on May 4,2015.  They went ahead and sent to me another form to fill out. This has been done and we have requested that this be considered urgent. I have left this detailed message for his Mom and ask that if she had any further questions to please call our office back. Otherwise, we would call them back  as we heard back from [facility].

## 2014-02-20 ENCOUNTER — Telehealth (INDEPENDENT_AMBULATORY_CARE_PROVIDER_SITE_OTHER): Payer: Self-pay | Admitting: *Deleted

## 2014-02-20 NOTE — Telephone Encounter (Signed)
Patient was called and a message left. I explained that the Heloise PurpuraViekira was denied and that Dr.Rehman had ask that we call in Harvoni. I talked with Jaz with [facility] and they are going to fax a new PA request form for the Harvoni. If approved I will need to call the warehouse number to insure that the correct mail order Pharmacy is called @ [phone].

## 2014-03-10 ENCOUNTER — Ambulatory Visit (INDEPENDENT_AMBULATORY_CARE_PROVIDER_SITE_OTHER): Payer: BC Managed Care – PPO | Admitting: Internal Medicine

## 2014-03-12 ENCOUNTER — Telehealth (INDEPENDENT_AMBULATORY_CARE_PROVIDER_SITE_OTHER): Payer: Self-pay | Admitting: *Deleted

## 2014-03-12 ENCOUNTER — Encounter (INDEPENDENT_AMBULATORY_CARE_PROVIDER_SITE_OTHER): Payer: Self-pay | Admitting: *Deleted

## 2014-03-12 NOTE — Telephone Encounter (Signed)
Noted. Patient has not rec' his medication for the treatment of Hep C. This is why he was on for this week.

## 2014-03-12 NOTE — Telephone Encounter (Signed)
Jerome LitesGregory No Showed for his apt on 03/10/14 with Dr. Karilyn Cotaehman. A letter has been sent out to the patient.

## 2014-03-19 ENCOUNTER — Telehealth (INDEPENDENT_AMBULATORY_CARE_PROVIDER_SITE_OTHER): Payer: Self-pay | Admitting: *Deleted

## 2014-03-19 NOTE — Telephone Encounter (Signed)
I have called and talked with Jerome Parsons about the status of the patient's Harvoni. I explained that I had faxed, faxed and faxed to [facility] all the information that was requested for a PA. We have heard nothing regarding this matter , she is planning call me on 03/24/14 @ 8 am, so that we may do a conference call with [facility]. Meanwhile, I am going to refax the information to [facility].

## 2014-03-19 NOTE — Telephone Encounter (Signed)
noted 

## 2014-03-24 ENCOUNTER — Telehealth (INDEPENDENT_AMBULATORY_CARE_PROVIDER_SITE_OTHER): Payer: Self-pay | Admitting: *Deleted

## 2014-03-24 NOTE — Telephone Encounter (Signed)
Patient has been approved for Harvoni #28 for 12 weeks of therapy. We rec'd the paper work and I also called the patient's insurance to verify again and also the pharmacy that is required for the patient to use. He has to use [facility]. A prescription was called to them/ Shanise : Harvoni  - Take 1 by mouth daily #28 with 2 refills. She states that they will expedite since the patient has been waiting several months to get the medication. His mother's telephone number was provided, as she is the support person. They will cal her to make the arrangements of the medication being mailed out. I ask that she call our office when ir arrived , or prior to patient taking.

## 2014-04-08 ENCOUNTER — Telehealth (INDEPENDENT_AMBULATORY_CARE_PROVIDER_SITE_OTHER): Payer: Self-pay | Admitting: *Deleted

## 2014-04-08 NOTE — Telephone Encounter (Signed)
Jerome Parsons would like to speak with Jerome Parsons before he starts his Hep C medication that was received on 04/07/14. His return phone number is [phone].

## 2014-04-08 NOTE — Telephone Encounter (Signed)
I have talked with the patient. He plans to start the [place] tonight,he will call and leave a voice mail for me if he does. Per Dr.Rehman the patient will need to have a office visit 5 weeks after he starts the medication , and the following labs : CBC, C-Met, Hepatitis C RNA Quant 4 weeks after starting the medication.

## 2014-04-09 ENCOUNTER — Telehealth (INDEPENDENT_AMBULATORY_CARE_PROVIDER_SITE_OTHER): Payer: Self-pay | Admitting: *Deleted

## 2014-04-09 ENCOUNTER — Other Ambulatory Visit (INDEPENDENT_AMBULATORY_CARE_PROVIDER_SITE_OTHER): Payer: Self-pay | Admitting: *Deleted

## 2014-04-09 DIAGNOSIS — B182 Chronic viral hepatitis C: Secondary | ICD-10-CM

## 2014-04-09 NOTE — Telephone Encounter (Signed)
Per Dr.Rehman the patient will need to have labs drawn in 4 weeks post starting Harvoni. Labs noted for 05-07-14.  Patient will need office visit 5 weeks with Dr.Rehman post starting Harvoni. Patient started 04/08/14. Appointment should be the week of May 11 2014.

## 2014-04-09 NOTE — Telephone Encounter (Signed)
[facility] started his medication on 04/08/14 at 5:30 pm.

## 2014-04-09 NOTE — Telephone Encounter (Signed)
Noted will order labs and arrange OV as per DR.Rehman.

## 2014-04-15 NOTE — Telephone Encounter (Signed)
Apt has been scheduled for 05/14/14 with Dr. Karilyn Cotaehman. Advised to have labs drawn on 05/07/14 also. Voices understood.

## 2014-04-15 NOTE — Telephone Encounter (Signed)
Apt has been scheduled for 05/14/14 at 4:00 pm with Dr. Karilyn Cotaehman.

## 2014-04-16 ENCOUNTER — Other Ambulatory Visit (INDEPENDENT_AMBULATORY_CARE_PROVIDER_SITE_OTHER): Payer: Self-pay | Admitting: Internal Medicine

## 2014-04-16 NOTE — Telephone Encounter (Signed)
Per Dr.Rehman may fill with no additional refills.

## 2014-04-22 ENCOUNTER — Other Ambulatory Visit (INDEPENDENT_AMBULATORY_CARE_PROVIDER_SITE_OTHER): Payer: Self-pay | Admitting: *Deleted

## 2014-04-22 ENCOUNTER — Encounter (INDEPENDENT_AMBULATORY_CARE_PROVIDER_SITE_OTHER): Payer: Self-pay | Admitting: *Deleted

## 2014-04-22 DIAGNOSIS — B182 Chronic viral hepatitis C: Secondary | ICD-10-CM

## 2014-05-07 LAB — CBC WITH DIFFERENTIAL/PLATELET
BASOS ABS: 0 10*3/uL (ref 0.0–0.1)
Basophils Relative: 0 % (ref 0–1)
Eosinophils Absolute: 0.1 10*3/uL (ref 0.0–0.7)
Eosinophils Relative: 3 % (ref 0–5)
HCT: 45.4 % (ref 39.0–52.0)
HEMOGLOBIN: 16.3 g/dL (ref 13.0–17.0)
LYMPHS PCT: 35 % (ref 12–46)
Lymphs Abs: 1.7 10*3/uL (ref 0.7–4.0)
MCH: 31.8 pg (ref 26.0–34.0)
MCHC: 35.9 g/dL (ref 30.0–36.0)
MCV: 88.7 fL (ref 78.0–100.0)
MONO ABS: 0.4 10*3/uL (ref 0.1–1.0)
MONOS PCT: 8 % (ref 3–12)
NEUTROS PCT: 54 % (ref 43–77)
Neutro Abs: 2.6 10*3/uL (ref 1.7–7.7)
Platelets: 65 10*3/uL — ABNORMAL LOW (ref 150–400)
RBC: 5.12 MIL/uL (ref 4.22–5.81)
RDW: 13.1 % (ref 11.5–15.5)
WBC: 4.8 10*3/uL (ref 4.0–10.5)

## 2014-05-12 LAB — HEPATITIS C RNA QUANTITATIVE: HCV Quantitative: <15 IU/mL (ref <15)

## 2014-05-14 ENCOUNTER — Ambulatory Visit (INDEPENDENT_AMBULATORY_CARE_PROVIDER_SITE_OTHER): Payer: BC Managed Care – PPO | Admitting: Internal Medicine

## 2014-05-14 ENCOUNTER — Encounter (INDEPENDENT_AMBULATORY_CARE_PROVIDER_SITE_OTHER): Payer: Self-pay | Admitting: Internal Medicine

## 2014-05-14 VITALS — BP 120/74 | HR 70 | Temp 98.3°F | Resp 18 | Ht 69.0 in | Wt 182.6 lb

## 2014-05-14 DIAGNOSIS — K7460 Unspecified cirrhosis of liver: Secondary | ICD-10-CM

## 2014-05-14 DIAGNOSIS — B182 Chronic viral hepatitis C: Secondary | ICD-10-CM

## 2014-05-14 DIAGNOSIS — G4700 Insomnia, unspecified: Secondary | ICD-10-CM

## 2014-05-14 MED ORDER — LORAZEPAM 1 MG PO TABS: ORAL_TABLET | ORAL | Status: DC

## 2014-05-14 NOTE — Progress Notes (Signed)
Presenting complaint;  Followup for hepatitis C.  Subjective:  Patient is 53 year old Caucasian male who has chronic hepatitis C genotype 1A with cirrhosis who was begun on Harvoni on 04/08/2014. He has not missed any doses. He is having no side effects. At times he feels fatigued but then at other times he feels he has lot more energy than he had previously. He is unable to sleep without lorazepam. He feels this is because of chronic anxiety. He has not had any alcohol in 6 months. He remains with good appetite denies abdominal pain.   Current Medications: Outpatient Encounter Prescriptions as of 05/14/2014  Medication Sig  . clobetasol (OLUX) 0.05 % topical foam Apply topically as needed.   Marland Kitchen HARVONI 90-400 MG TABS Take 90-400 mg by mouth daily.   Marland Kitchen LORazepam (ATIVAN) 1 MG tablet TAKE 1 1/2 TABLETS BY MOUTH EVERY DAY AS NEEDED FOR ANXIETY     Objective: Blood pressure 120/74, pulse 70, temperature 98.3 F (36.8 C), temperature source Oral, resp. rate 18, height  (1.753 m), weight 182 lb 9.6 oz (82.827 kg). Patient is alert and in no acute distress. Conjunctiva is pink. Sclera is nonicteric Oropharyngeal mucosa is normal. No neck masses or thyromegaly noted. Cardiac exam with regular rhythm normal S1 and S2. No murmur or gallop noted. Lungs are clear to auscultation. Abdomen is symmetrical. Scaly erythematous rash noted to the skin at the umbilicus. Abdomen is soft and nontender. Spleen tip is palpable. Liver edge is indistinct below RCM. No LE edema or clubbing noted.  Labs/studies Results: Lab data from 05/07/2014; WBC 4.8, H&H 16.3 and 45.4 and platelet count 65K.  HCVRNA quantitative less than 15 international units/ml   Assessment:  #1. Chronic hepatitis C genotype 1a. Patient has completed 5 weeks of antiviral therapy with Harvoni.  HCVRNA is detectable but titer less than 15 international units/mL. Therapy with approved only for 12 weeks even though I suspect he has  stage IV disease. Liver biopsy 5 years ago revealed stage III disease. He is having minimal side effects with therapy. #2. Insomnia/anxiety. . Plan:  New prescription for Lorazepam 1.5 mg by mouth each bedtime for one month given. HCVRNA by PCR in in 3 weeks. Office visit in 8 weeks.

## 2014-05-14 NOTE — Patient Instructions (Signed)
Notify if you have any side effects with medication. Next blood work would be in 7 weeks when you finish Harvoni.

## 2014-05-15 ENCOUNTER — Telehealth (INDEPENDENT_AMBULATORY_CARE_PROVIDER_SITE_OTHER): Payer: Self-pay | Admitting: *Deleted

## 2014-05-15 DIAGNOSIS — B182 Chronic viral hepatitis C: Secondary | ICD-10-CM

## 2014-05-15 NOTE — Telephone Encounter (Signed)
Per Dr.Rehman the patient will need to have labs drawn in 3 weeks. 

## 2014-05-27 ENCOUNTER — Encounter (INDEPENDENT_AMBULATORY_CARE_PROVIDER_SITE_OTHER): Payer: Self-pay | Admitting: *Deleted

## 2014-05-27 ENCOUNTER — Other Ambulatory Visit (INDEPENDENT_AMBULATORY_CARE_PROVIDER_SITE_OTHER): Payer: Self-pay | Admitting: *Deleted

## 2014-05-27 DIAGNOSIS — B182 Chronic viral hepatitis C: Secondary | ICD-10-CM

## 2014-06-09 LAB — HEPATITIS C RNA QUANTITATIVE: HCV QUANT: NOT DETECTED [IU]/mL (ref <15)

## 2014-07-15 ENCOUNTER — Telehealth (INDEPENDENT_AMBULATORY_CARE_PROVIDER_SITE_OTHER): Payer: Self-pay | Admitting: *Deleted

## 2014-07-15 DIAGNOSIS — B182 Chronic viral hepatitis C: Secondary | ICD-10-CM

## 2014-07-15 NOTE — Telephone Encounter (Signed)
Per Dr.Rehman the patient will need to have labs drawn at the End of Treatment. Patient started on 04/08/14 this was for 12 weeks of therapy. Patient completed therapy 06/24/14.

## 2014-07-16 LAB — COMPREHENSIVE METABOLIC PANEL
ALK PHOS: 85 U/L (ref 39–117)
ALT: 28 U/L (ref 0–53)
AST: 34 U/L (ref 0–37)
Albumin: 3.9 g/dL (ref 3.5–5.2)
BUN: 11 mg/dL (ref 6–23)
CO2: 23 meq/L (ref 19–32)
Calcium: 9.1 mg/dL (ref 8.4–10.5)
Chloride: 108 mEq/L (ref 96–112)
Creat: 0.86 mg/dL (ref 0.50–1.35)
Glucose, Bld: 126 mg/dL — ABNORMAL HIGH (ref 70–99)
Potassium: 3.6 mEq/L (ref 3.5–5.3)
SODIUM: 138 meq/L (ref 135–145)
TOTAL PROTEIN: 6.9 g/dL (ref 6.0–8.3)
Total Bilirubin: 1.1 mg/dL (ref 0.2–1.2)

## 2014-07-16 LAB — CBC
HCT: 43.9 % (ref 39.0–52.0)
Hemoglobin: 15.5 g/dL (ref 13.0–17.0)
MCH: 32.3 pg (ref 26.0–34.0)
MCHC: 35.3 g/dL (ref 30.0–36.0)
MCV: 91.5 fL (ref 78.0–100.0)
PLATELETS: 75 10*3/uL — AB (ref 150–400)
RBC: 4.8 MIL/uL (ref 4.22–5.81)
RDW: 13.9 % (ref 11.5–15.5)
WBC: 5.5 10*3/uL (ref 4.0–10.5)

## 2014-07-17 LAB — HEPATITIS C RNA QUANTITATIVE: HCV QUANT: NOT DETECTED [IU]/mL (ref <15)

## 2014-07-21 ENCOUNTER — Ambulatory Visit (INDEPENDENT_AMBULATORY_CARE_PROVIDER_SITE_OTHER): Payer: BC Managed Care – PPO | Admitting: Internal Medicine

## 2014-07-21 ENCOUNTER — Encounter (INDEPENDENT_AMBULATORY_CARE_PROVIDER_SITE_OTHER): Payer: Self-pay | Admitting: Internal Medicine

## 2014-07-21 VITALS — BP 116/70 | HR 76 | Temp 98.1°F | Resp 18 | Ht 69.0 in | Wt 184.2 lb

## 2014-07-21 DIAGNOSIS — G4700 Insomnia, unspecified: Secondary | ICD-10-CM

## 2014-07-21 DIAGNOSIS — K7469 Other cirrhosis of liver: Secondary | ICD-10-CM

## 2014-07-21 DIAGNOSIS — Z8619 Personal history of other infectious and parasitic diseases: Secondary | ICD-10-CM

## 2014-07-21 MED ORDER — LORAZEPAM 1 MG PO TABS: ORAL_TABLET | ORAL | Status: DC

## 2014-07-21 NOTE — Patient Instructions (Signed)
Next blood work and ultrasound would be in March 2016

## 2014-07-21 NOTE — Progress Notes (Signed)
Presenting complaint;  Follow-up regarding cirrhosis and hepatitis C.  Subjective:  Patient states he finished Harvoni on 06/30/2014. He had blood work last week as planned. He continues to abstain from drinking alcohol. He did not experience any side effects other than feeling tired. He denies abdominal pain distention or lower extremity edema. His appetite is normal. He does not do any exercise on regular basis. He is unable to sleep unless he takes lorazepam but he does not take daily. His weight is up by 2 pounds.   Current Medications: Outpatient Encounter Prescriptions as of 07/21/2014  Medication Sig  . clobetasol (OLUX) 0.05 % topical foam Apply topically as needed.   Marland Kitchen. LORazepam (ATIVAN) 1 MG tablet TAKE 1 1/2 TABLETS BY MOUTH EVERY DAY AS NEEDED FOR ANXIETY  . HARVONI 90-400 MG TABS Take 90-400 mg by mouth daily.      Objective: Blood pressure 116/70, pulse 76, temperature 98.1 F (36.7 C), temperature source Oral, resp. rate 18, height 5\' 9"  (1.753 m), weight 184 lb 3.2 oz (83.553 kg). Patient is alert and does not have asterixis. Conjunctiva is pink. Sclera is nonicteric Oropharyngeal mucosa is normal. No neck masses or thyromegaly noted. Cardiac exam with regular rhythm normal S1 and S2. No murmur or gallop noted. Lungs are clear to auscultation. Abdomen is full. Scaly erythematous rash noted around umbilicus. Abdomen is soft and nontender. Spleen tip is easily palpable as is left lobe of the liver. Liver does not feel firm. No LE edema or clubbing noted.  Labs/studies Results: Lab data from 07/16/2014. WBC 5.5, H&H is 15.5 and 43.9 and platelet count 75K. Serum sodium 138, potassium 3.6, chloride 108, CO2 23, BUN 11, creatinine 0.86 and glucose 126 Bilirubin 1.1, AP 85, AST 34 and ALT 28. Total protein 6.9 and albumin 3.9. Serum calcium 9.1.  HCV RNA by PCR is undetectable.  Assessment:  #1. 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 His condition is well  compensated.  #2. Chronic hepatitis C genotype 1a. He has cleared virus with 12 weeks of Harvoni. Transaminases have normalized. He will have HCV RNA in few months to document as we are. #3. Thrombocytopenia secondary to cirrhosis and splenomegaly. #4. Chronic insomnia. Lorazepam is helping.   Plan:  Patient encouraged to continue alcohol abstinence in order to improve long-term prognosis. Patient encouraged to exercise on regular basis. New prescription given for lorazepam 1.5 mg by mouth daily at bedtime when necessary; 45 tablets of 1 mg each with 2 refills. Patient will have CBC, comprehensive chemistry panel, AFP and HCV RNA by PCR in March 2016. He will also have upper abdominal ultrasound in March 2016 [facility] If he remains free of disease he will return for follow-up visit in one year.

## 2014-07-22 ENCOUNTER — Telehealth (INDEPENDENT_AMBULATORY_CARE_PROVIDER_SITE_OTHER): Payer: Self-pay | Admitting: *Deleted

## 2014-07-22 DIAGNOSIS — G4700 Insomnia, unspecified: Secondary | ICD-10-CM

## 2014-07-22 DIAGNOSIS — Z8619 Personal history of other infectious and parasitic diseases: Secondary | ICD-10-CM

## 2014-07-22 DIAGNOSIS — B182 Chronic viral hepatitis C: Secondary | ICD-10-CM

## 2014-07-22 NOTE — Telephone Encounter (Signed)
Per Dr.Rehman the patient will need to have labs drawn March 2016.

## 2014-10-22 ENCOUNTER — Encounter (INDEPENDENT_AMBULATORY_CARE_PROVIDER_SITE_OTHER): Payer: Self-pay | Admitting: *Deleted

## 2014-10-22 ENCOUNTER — Other Ambulatory Visit (INDEPENDENT_AMBULATORY_CARE_PROVIDER_SITE_OTHER): Payer: Self-pay | Admitting: *Deleted

## 2014-10-22 DIAGNOSIS — Z8619 Personal history of other infectious and parasitic diseases: Secondary | ICD-10-CM

## 2014-10-22 DIAGNOSIS — G4700 Insomnia, unspecified: Secondary | ICD-10-CM

## 2014-11-21 LAB — COMPREHENSIVE METABOLIC PANEL
ALT: 30 U/L (ref 0–53)
AST: 29 U/L (ref 0–37)
Albumin: 3.9 g/dL (ref 3.5–5.2)
Alkaline Phosphatase: 92 U/L (ref 39–117)
BUN: 8 mg/dL (ref 6–23)
CO2: 21 meq/L (ref 19–32)
CREATININE: 0.75 mg/dL (ref 0.50–1.35)
Calcium: 8.6 mg/dL (ref 8.4–10.5)
Chloride: 109 mEq/L (ref 96–112)
Glucose, Bld: 74 mg/dL (ref 70–99)
POTASSIUM: 3.7 meq/L (ref 3.5–5.3)
SODIUM: 144 meq/L (ref 135–145)
Total Bilirubin: 1 mg/dL (ref 0.2–1.2)
Total Protein: 7.2 g/dL (ref 6.0–8.3)

## 2014-11-21 LAB — CBC
HEMATOCRIT: 46.2 % (ref 39.0–52.0)
HEMOGLOBIN: 16.2 g/dL (ref 13.0–17.0)
MCH: 31.3 pg (ref 26.0–34.0)
MCHC: 35.1 g/dL (ref 30.0–36.0)
MCV: 89.4 fL (ref 78.0–100.0)
MPV: 12.8 fL — ABNORMAL HIGH (ref 8.6–12.4)
PLATELETS: 87 10*3/uL — AB (ref 150–400)
RBC: 5.17 MIL/uL (ref 4.22–5.81)
RDW: 13.4 % (ref 11.5–15.5)
WBC: 10.6 10*3/uL — AB (ref 4.0–10.5)

## 2014-11-21 LAB — AFP TUMOR MARKER: AFP TUMOR MARKER: 4.3 ng/mL (ref <6.1)

## 2014-11-24 LAB — HEPATITIS C RNA QUANTITATIVE: HCV Quantitative: NOT DETECTED IU/mL (ref <15)

## 2015-01-06 ENCOUNTER — Telehealth (INDEPENDENT_AMBULATORY_CARE_PROVIDER_SITE_OTHER): Payer: Self-pay | Admitting: *Deleted

## 2015-01-06 NOTE — Telephone Encounter (Signed)
Jerome Parsons has been having trouble with his hands and Dr. Orvan Falconerampbell is treating him. She has given him Prednisone and Cortisone shots. He would like to know what he can take or not take for arthritis. The return phone number is [phone].

## 2015-01-06 NOTE — Telephone Encounter (Signed)
Per Dr.Rehman the patient may take Tylenol no more that 2 grams a day - Must not use alcohol. If this does not help he may take Advil,nor more than 4 per day, should really use on an as needed basis. Patient was called and made aware.

## 2015-01-06 NOTE — Telephone Encounter (Signed)
This will be addressed with Dr.Rehman, then the patient will be contacted.

## 2015-01-26 ENCOUNTER — Other Ambulatory Visit (INDEPENDENT_AMBULATORY_CARE_PROVIDER_SITE_OTHER): Payer: Self-pay | Admitting: Internal Medicine

## 2015-03-07 IMAGING — US US ABDOMEN COMPLETE W/ ELASTOGRAPHY
1 series · 13 of 16 positions shown · non-contrast
Comparison: Ultrasound abdomen dated 03/08/2012

CORRELATIVE IMAGING:
None.

CLINICAL DATA: Hepatitis C

EXAM:
ULTRASOUND ABDOMEN
ULTRASOUND HEPATIC ELASTOGRAPHY
TECHNIQUE: Sonography of the upper abdomen was perform, in addition, ultrasound
elastography evaluation of the liver was performed. A region of
interest was placed within the right lobe of the liver. Following
application of a compressive sonographic pulse, shear waves were
detected in the adjacent hepatic tissue and the shear wave velocity
was calculated. Multiple assessments were performed at the selected
site. Median shear wave velocity is correlated to a Metavir fibrosis
score.

[Series 1: us abdomen complete w/ elastography · 0.17mm/px · 13 of 16 slices shown]
[im 1/16]
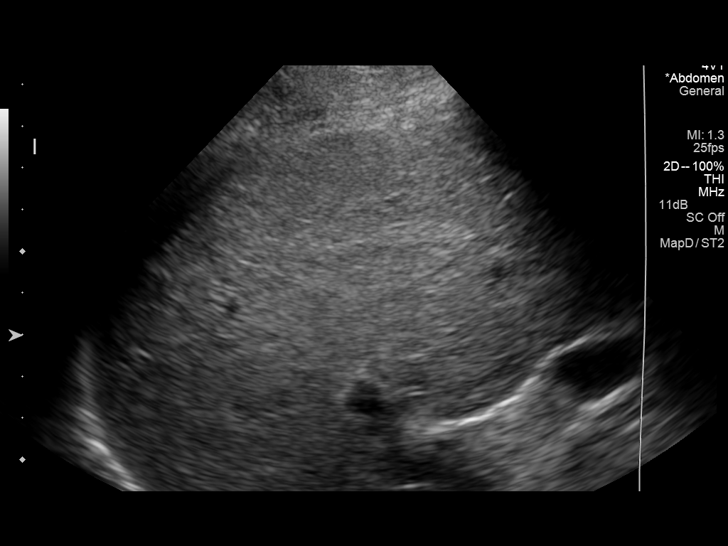
[im 2/16]
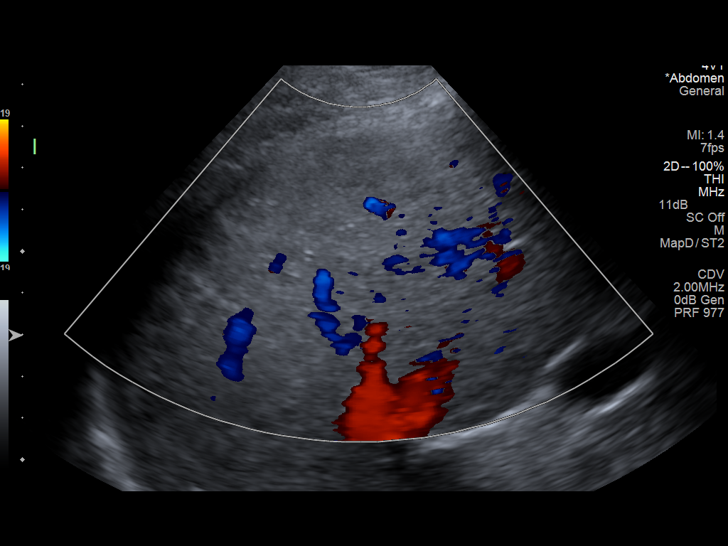
[im 4/16]
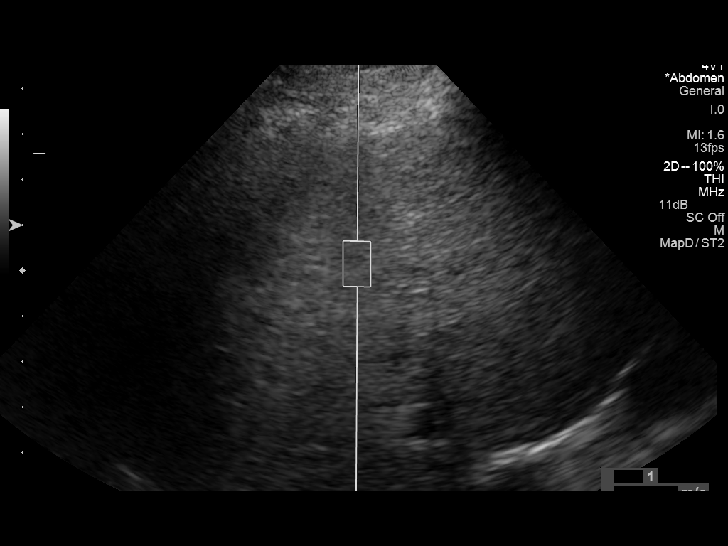
[im 5/16]
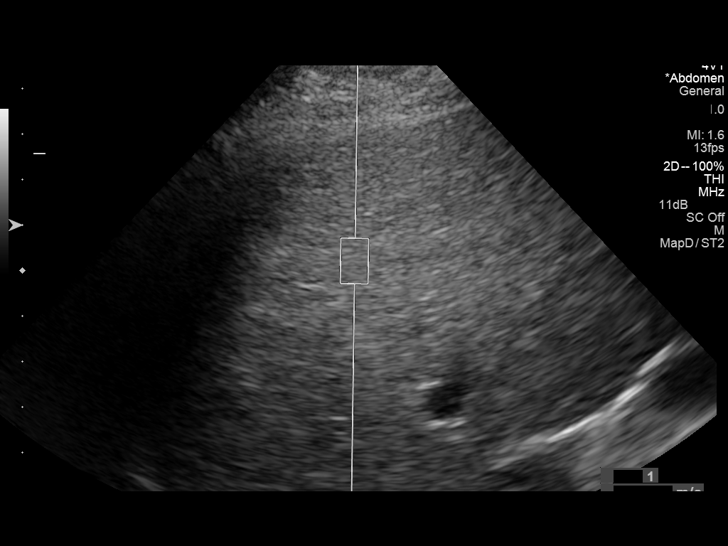
[im 6/16]
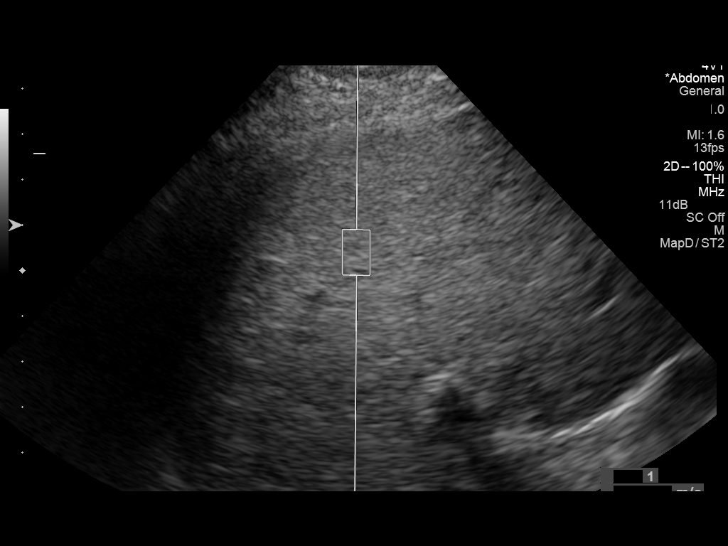
[im 7/16]
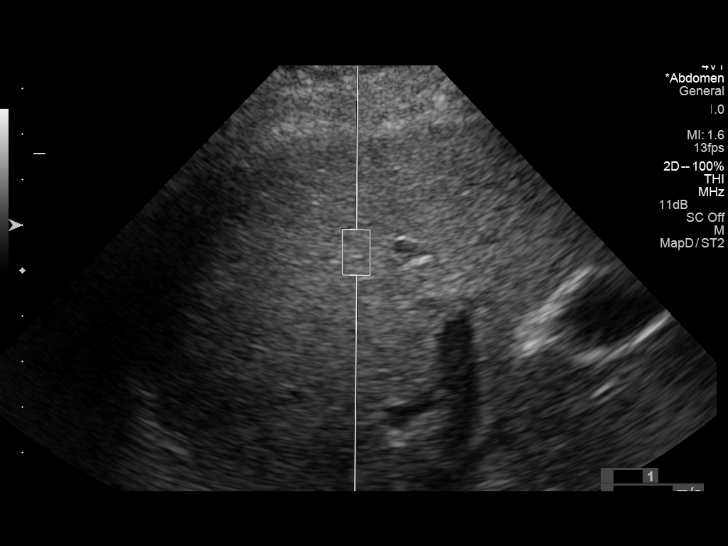
[im 9/16]
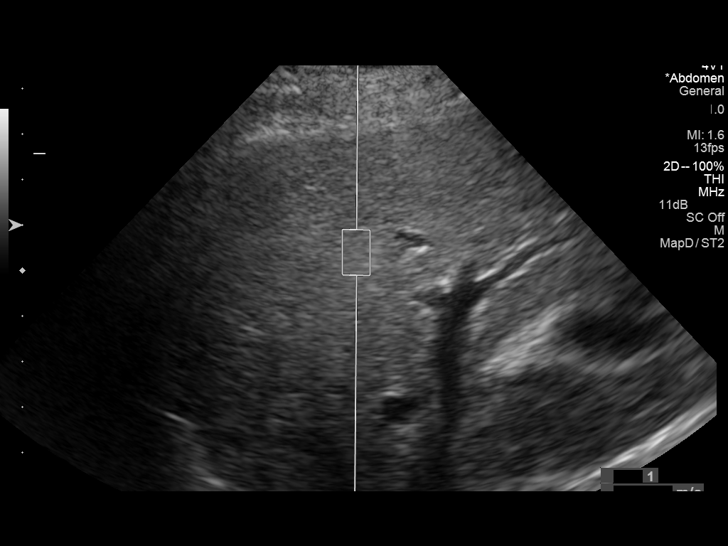
[im 10/16]
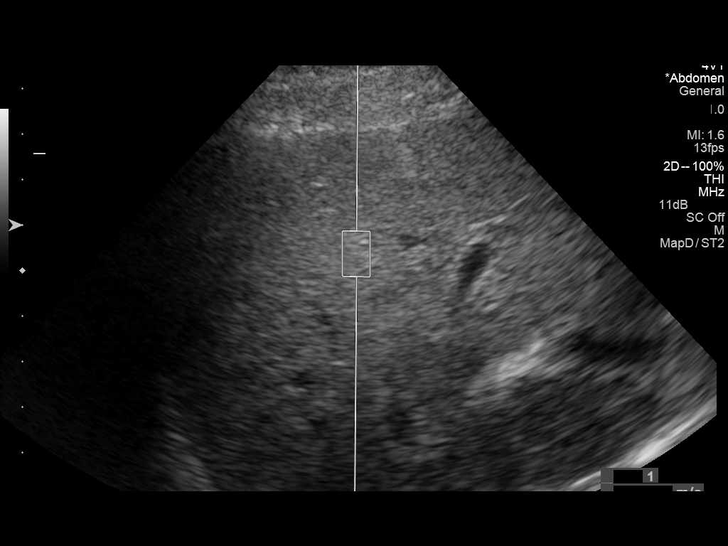
[im 11/16]
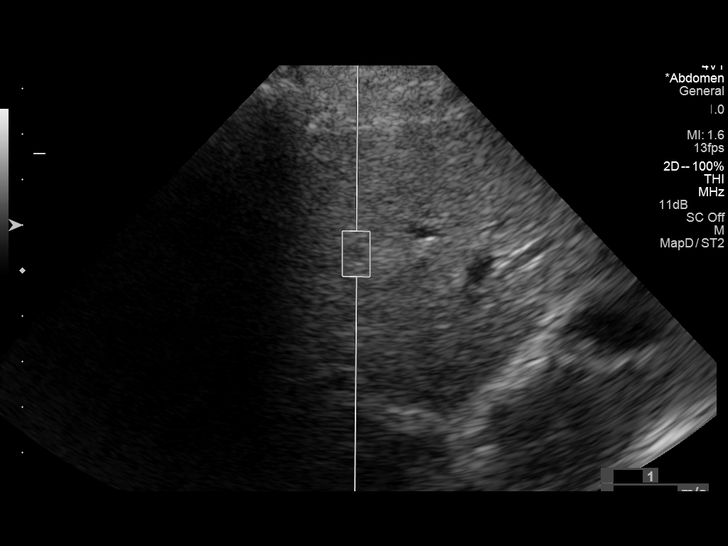
[im 12/16]
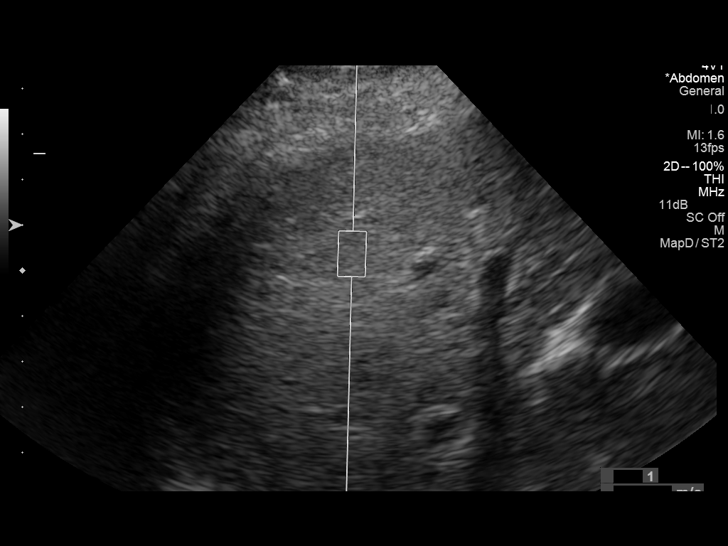
[im 13/16]
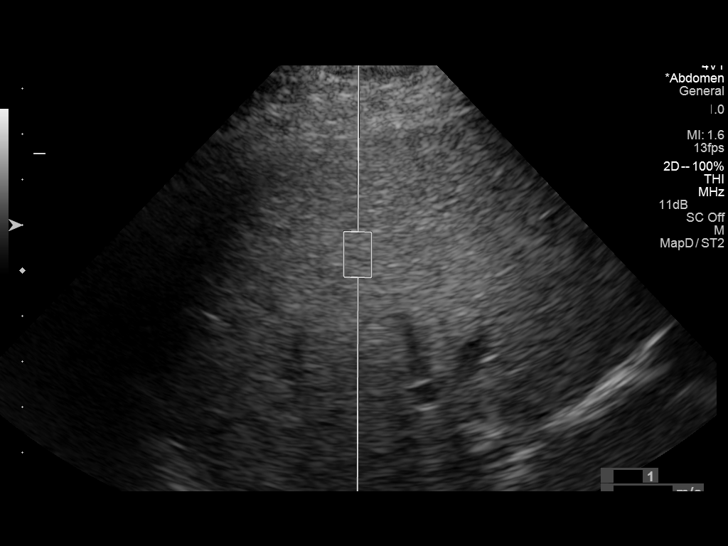
[im 15/16]
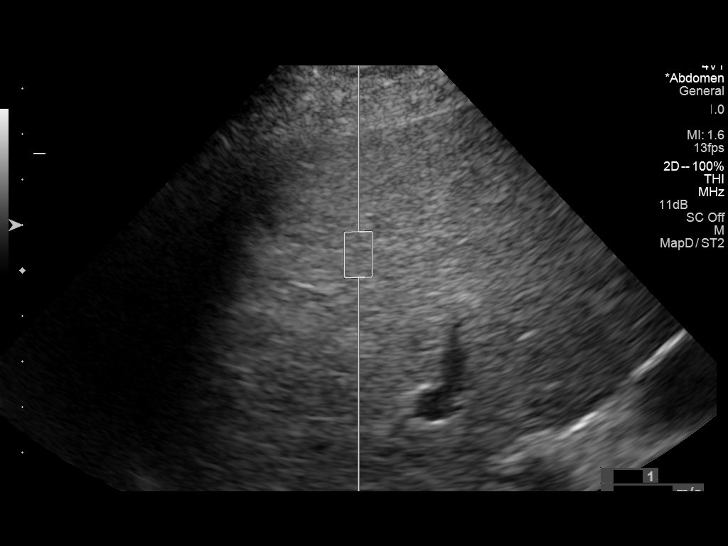
[im 16/16]
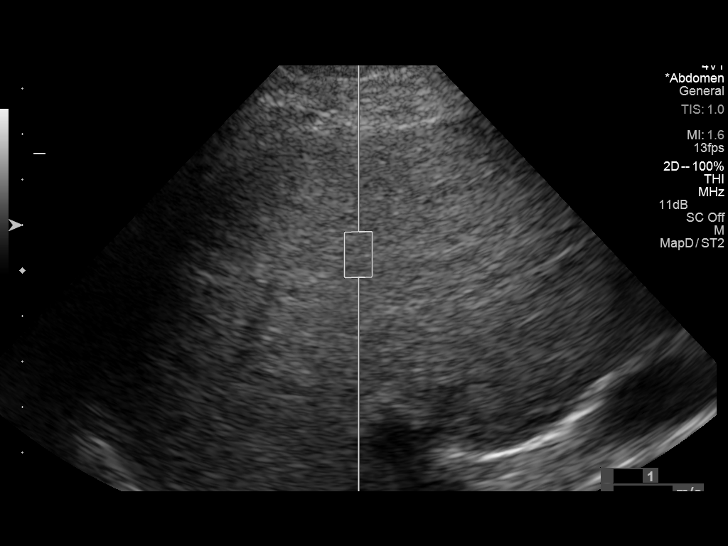

[13 of 16 positions shown; findings below may reference images not displayed]

FINDINGS: ULTRASOUND ABDOMEN

Gallbladder:

No gallstones, gallbladder wall thickening, or pericholecystic
fluid. Negative sonographic Murphy's sign.

Common bile duct:

Diameter: 4 mm.

Liver:

Nodular hepatic contour with coarse, echogenic hepatic parenchyma,
likely reflecting cirrhosis. No focal hepatic lesion is seen.

IVC:

Poorly visualized.

Pancreas:

Not visualized due to overlying bowel gas.

Spleen:

Enlarged, measuring 13.2 x 12.0 x 6.6 cm (calculated volume 543 mL).

Right Kidney:

Length: 11.1 cm.  No mass or hydronephrosis.

Left Kidney:

Length: 11.9 cm.  No mass or hydronephrosis.

Abdominal aorta:

No aneurysm visualized.

Other findings:

None.

ULTRASOUND HEPATIC ELASTOGRAPHY

Hepatic Segment:  8

Number of measurements:  11

Median velocity:   3.04  m/sec

Corresponding Metavir fibrosis score:  F4

Pertinent liver disease findings noted on other imaging exams: None
noted or none available

Please note that abnormal shear wave velocities may also be
identified in clinical settings other than with hepatic fibrosis,
such as: Acute hepatitis, elevated right heart and central venous
pressures, Shoto disease (Jeon), infiltrative
processes such as mastocytosis/amyloidosis/infiltrative tumor,
extrahepatic cholestasis, in the post-prandial state, and liver
transplantation. Correlation with patient history, laboratory data,
and clinical condition recommended.
IMPRESSION: ULTRASOUND ABDOMEN:
Cirrhosis.  No focal hepatic lesion is seen.

Mild splenomegaly.

ULTRASOUND HEPATIC ELASTOGRAPHY:
Median hepatic shear wave velocity is calculated at 3.04 m/sec,
which corresponds to a Metavir fibrosis score of F4.

## 2015-05-24 ENCOUNTER — Encounter (INDEPENDENT_AMBULATORY_CARE_PROVIDER_SITE_OTHER): Payer: Self-pay | Admitting: *Deleted

## 2015-07-22 ENCOUNTER — Ambulatory Visit (INDEPENDENT_AMBULATORY_CARE_PROVIDER_SITE_OTHER): Payer: Self-pay | Admitting: Internal Medicine

## 2015-08-11 ENCOUNTER — Other Ambulatory Visit (INDEPENDENT_AMBULATORY_CARE_PROVIDER_SITE_OTHER): Payer: Self-pay | Admitting: Internal Medicine

## 2015-08-16 ENCOUNTER — Other Ambulatory Visit (INDEPENDENT_AMBULATORY_CARE_PROVIDER_SITE_OTHER): Payer: Self-pay | Admitting: Internal Medicine

## 2021-05-02 ENCOUNTER — Other Ambulatory Visit: Payer: Self-pay

## 2021-05-02 ENCOUNTER — Ambulatory Visit: Payer: BC Managed Care – PPO | Admitting: Dermatology

## 2021-05-02 ENCOUNTER — Encounter: Payer: Self-pay | Admitting: Dermatology

## 2021-05-02 VITALS — BP 150/98 | Wt 184.0 lb

## 2021-05-02 DIAGNOSIS — L400 Psoriasis vulgaris: Secondary | ICD-10-CM

## 2021-05-02 MED ORDER — HALOBETASOL PROPIONATE 0.05 % EX CREA: TOPICAL_CREAM | Freq: Two times a day (BID) | CUTANEOUS | 6 refills | Status: AC | PRN

## 2021-05-02 NOTE — Patient Instructions (Signed)
[email or web address]  ?

## 2021-05-10 NOTE — Progress Notes (Signed)
   Follow-Up Visit   Subjective  Jerome Parsons is a 60 y.o. male who presents for the following: Psoriasis (Flared up on legs, arms, scalp, & buttock tx- different doctor gave topical cream- can't remember name & has home light box - unable to do oral because of of liver).  Psoriasis Location:  Duration:  Quality: Spreading and negatively impacting life Associated Signs/Symptoms: Modifying Factors:  Severity:  Timing: Context:   Objective  Well appearing patient in no apparent distress; mood and affect are within normal limits. Gluteal Crease, Left Forearm - Anterior, Left Hand - Posterior, Left Hip (side) - Posterior, Left Lower Leg - Anterior, Right Forearm - Anterior, Right Hand - Posterior, Right Hip (side) - Posterior, Right Lower Leg - Anterior, Scalp Roughly 20% body surface area with a medium to large black psoriasis.  No synovitis.    A full examination was performed including scalp, head, eyes, ears, nose, lips, neck, chest, axillae, abdomen, back, buttocks, bilateral upper extremities, bilateral lower extremities, hands, feet, fingers, toes, fingernails, and toenails. All findings within normal limits unless otherwise noted below.   Assessment & Plan    Psoriasis vulgaris Left Hand - Posterior; Right Hand - Posterior; Left Hip (side) - Posterior; Right Hip (side) - Posterior; Left Forearm - Anterior; Right Forearm - Anterior; Left Lower Leg - Anterior; Right Lower Leg - Anterior; Gluteal Crease; Scalp  Extended discussion about all types of therapy.  I agree with Mr. Brueggemann that his hepatitis C with cirrhosis is a contraindication to any product with any potential hepatotoxicity, but this would not exclude Biologics.  Patient will read on [email or web address] about biologics and call us if he decides on biologic- will need biologic labwork if patient goes that route  halobetasol (ULTRAVATE) 0.05 % cream - Gluteal Crease, Left Forearm - Anterior, Left Hand - Posterior, Left  Hip (side) - Posterior, Left Lower Leg - Anterior, Right Forearm - Anterior, Right Hand - Posterior, Right Hip (side) - Posterior, Right Lower Leg - Anterior, Scalp Apply topically 2 (two) times daily as needed for up to 14 days.      I, Janalyn Harder, MD, have reviewed all documentation for this visit.  The documentation on 05/10/21 for the exam, diagnosis, procedures, and orders are all accurate and complete.

## 2021-06-01 ENCOUNTER — Ambulatory Visit: Payer: BC Managed Care – PPO | Admitting: Dermatology

## 2021-06-01 ENCOUNTER — Other Ambulatory Visit: Payer: Self-pay

## 2021-06-01 DIAGNOSIS — Z5181 Encounter for therapeutic drug level monitoring: Secondary | ICD-10-CM | POA: Diagnosis not present

## 2021-06-01 DIAGNOSIS — L400 Psoriasis vulgaris: Secondary | ICD-10-CM | POA: Diagnosis not present

## 2021-06-09 LAB — CBC WITH DIFFERENTIAL/PLATELET
Absolute Monocytes: 696 cells/uL (ref 200–950)
Basophils Absolute: 43 cells/uL (ref 0–200)
Basophils Relative: 0.6 %
Eosinophils Absolute: 142 cells/uL (ref 15–500)
Eosinophils Relative: 2 %
HCT: 48.9 % (ref 38.5–50.0)
Hemoglobin: 16.9 g/dL (ref 13.2–17.1)
Lymphs Abs: 1669 cells/uL (ref 850–3900)
MCH: 31.2 pg (ref 27.0–33.0)
MCHC: 34.6 g/dL (ref 32.0–36.0)
MCV: 90.2 fL (ref 80.0–100.0)
MPV: 13 fL — ABNORMAL HIGH (ref 7.5–12.5)
Monocytes Relative: 9.8 %
Neutro Abs: 4551 cells/uL (ref 1500–7800)
Neutrophils Relative %: 64.1 %
Platelets: 119 10*3/uL — ABNORMAL LOW (ref 140–400)
RBC: 5.42 10*6/uL (ref 4.20–5.80)
RDW: 12.2 % (ref 11.0–15.0)
Total Lymphocyte: 23.5 %
WBC: 7.1 10*3/uL (ref 3.8–10.8)

## 2021-06-09 LAB — HCV RNA,QUANTITATIVE REAL TIME PCR
HCV Quantitative Log: <1.18 Log IU/mL
HCV RNA, PCR, QN: <15 IU/mL

## 2021-06-09 LAB — COMPREHENSIVE METABOLIC PANEL
AG Ratio: 1.8 (calc) (ref 1.0–2.5)
ALT: 42 U/L (ref 9–46)
AST: 38 U/L — ABNORMAL HIGH (ref 10–35)
Albumin: 4.7 g/dL (ref 3.6–5.1)
Alkaline phosphatase (APISO): 70 U/L (ref 35–144)
BUN: 15 mg/dL (ref 7–25)
CO2: 22 mmol/L (ref 20–32)
Calcium: 9.5 mg/dL (ref 8.6–10.3)
Chloride: 105 mmol/L (ref 98–110)
Creat: 1.02 mg/dL (ref 0.70–1.30)
Globulin: 2.6 g/dL (calc) (ref 1.9–3.7)
Glucose, Bld: 82 mg/dL (ref 65–139)
Potassium: 3.9 mmol/L (ref 3.5–5.3)
Sodium: 137 mmol/L (ref 135–146)
Total Bilirubin: 0.9 mg/dL (ref 0.2–1.2)
Total Protein: 7.3 g/dL (ref 6.1–8.1)

## 2021-06-09 LAB — QUANTIFERON-TB GOLD PLUS
Mitogen-NIL: 8.04 IU/mL
NIL: 0.02 IU/mL
QuantiFERON-TB Gold Plus: NEGATIVE
TB1-NIL: 0 IU/mL
TB2-NIL: 0 IU/mL

## 2021-06-09 LAB — ANTI-NUCLEAR AB-TITER (ANA TITER): ANA Titer 1: 1:1280 {titer} — ABNORMAL HIGH

## 2021-06-09 LAB — HEPATITIS C ANTIBODY
Hepatitis C Ab: REACTIVE — AB
SIGNAL TO CUT-OFF: 27 — ABNORMAL HIGH (ref <1.00)

## 2021-06-09 LAB — HEPATITIS B SURFACE ANTIBODY, QUANTITATIVE: Hepatitis B-Post: 187 m[IU]/mL (ref >=10)

## 2021-06-09 LAB — ANA: Anti Nuclear Antibody (ANA): POSITIVE — AB

## 2021-06-09 LAB — HEPATITIS B SURFACE ANTIGEN: Hepatitis B Surface Ag: NONREACTIVE

## 2021-06-09 LAB — HEPATITIS B CORE ANTIBODY, TOTAL: Hep B Core Total Ab: REACTIVE — AB

## 2021-06-18 ENCOUNTER — Encounter: Payer: Self-pay | Admitting: Dermatology

## 2021-06-18 NOTE — Progress Notes (Signed)
   Follow-Up Visit   Subjective  Jerome Parsons is a 60 y.o. male who presents for the following: Psoriasis (Here for follow up. Patient is about the same. Treatment is halobetasol cream. ).  Psoriasis Location:  Duration:  Quality:  Associated Signs/Symptoms: Modifying Factors:  Severity:  Timing: Context:   Objective  Well appearing patient in no apparent distress; mood and affect are within normal limits. Left Forearm - Anterior, Left Hand - Anterior, Left Lower Leg - Anterior, Right Forearm - Anterior, Right Hand - Anterior, Right Lower Leg - Anterior Extensive small to medium Psoriasis without active synovitis.  Approximately 20% BSA involved.    All skin waist up examined.   Assessment & Plan    Plaque psoriasis Left Hand - Anterior; Right Hand - Anterior; Left Forearm - Anterior; Right Forearm - Anterior; Left Lower Leg - Anterior; Right Lower Leg - Anterior  All treatment options again reviewed in some detail including patient's history of drinking and how this could impact his psoriasis.  Skyrizi new start.  Comprehensive metabolic panel - Left Forearm - Anterior, Left Hand - Anterior, Left Lower Leg - Anterior, Right Forearm - Anterior, Right Hand - Anterior, Right Lower Leg - Anterior  CBC with Differential/Platelet - Left Forearm - Anterior, Left Hand - Anterior, Left Lower Leg - Anterior, Right Forearm - Anterior, Right Hand - Anterior, Right Lower Leg - Anterior  Hepatitis B surface antibody,quantitative - Left Forearm - Anterior, Left Hand - Anterior, Left Lower Leg - Anterior, Right Forearm - Anterior, Right Hand - Anterior, Right Lower Leg - Anterior  Hepatitis B surface antigen - Left Forearm - Anterior, Left Hand - Anterior, Left Lower Leg - Anterior, Right Forearm - Anterior, Right Hand - Anterior, Right Lower Leg - Anterior  Hepatitis C antibody - Left Forearm - Anterior, Left Hand - Anterior, Left Lower Leg - Anterior, Right Forearm - Anterior, Right  Hand - Anterior, Right Lower Leg - Anterior  QuantiFERON-TB Gold Plus - Left Forearm - Anterior, Left Hand - Anterior, Left Lower Leg - Anterior, Right Forearm - Anterior, Right Hand - Anterior, Right Lower Leg - Anterior  ANA - Left Forearm - Anterior, Left Hand - Anterior, Left Lower Leg - Anterior, Right Forearm - Anterior, Right Hand - Anterior, Right Lower Leg - Anterior  Related Procedures Hepatitis B core antibody, total  Encounter for therapeutic drug monitoring  Related Procedures Comprehensive metabolic panel CBC with Differential/Platelet Hepatitis B surface antibody,quantitative Hepatitis B surface antigen Hepatitis B core antibody, total Hepatitis C antibody QuantiFERON-TB Gold Plus ANA   Other Procedures Placed This Encounter Anti-nuclear ab-titer (ANA titer) HCV RNA, Quantitative Real Time PCR     I, Janalyn Harder, MD, have reviewed all documentation for this visit.  The documentation on 06/18/21 for the exam, diagnosis, procedures, and orders are all accurate and complete.

## 2021-06-21 ENCOUNTER — Telehealth: Payer: Self-pay

## 2021-06-21 NOTE — Telephone Encounter (Signed)
Based on this result, the sample will be tested  for HCV RNA by a Nucleic Acid Amplification Test (NAAT)  to determine if the patient has a current active  infection.   Patient aware and will call 10/24 to see if he need to keep the visit on the 25th if the HEP C PANEL HAS RESULTED FOR THE ADDITIONAL TEST

## 2021-06-21 NOTE — Telephone Encounter (Signed)
-----   Message from Janalyn Harder, MD sent at 06/09/2021  6:02 AM EDT ----- Will need to await results of second hepatitis C test before we can proceed to initiate biologic therapy. ----- Message ----- From: Janace Hoard Lab Results In Sent: 06/09/2021   1:55 AM EDT To: Janalyn Harder, MD

## 2021-06-28 ENCOUNTER — Telehealth: Payer: Self-pay | Admitting: *Deleted

## 2021-06-28 ENCOUNTER — Ambulatory Visit: Payer: BC Managed Care – PPO | Admitting: Dermatology

## 2021-06-28 NOTE — Telephone Encounter (Signed)
Patient is calling to check and see what his next step should be for [employer].   Per nurses today's appointment is canceled and we will check to see what Janalyn Harder, M.D.'s recommendation for the next step will be.

## 2021-06-28 NOTE — Telephone Encounter (Signed)
Message sent to dr tafeen for next step

## 2021-07-06 ENCOUNTER — Telehealth: Payer: Self-pay | Admitting: *Deleted

## 2021-07-06 NOTE — Telephone Encounter (Signed)
Sent back to dr tafeen so that he could write letter to patient [facility].

## 2021-07-07 NOTE — Telephone Encounter (Signed)
Contacted [facility] asking for information on chronic Hep C and Cirrhosis for Skyrizi new start. Paper work started but not submitted to [employer] yet.

## 2021-07-14 NOTE — Telephone Encounter (Signed)
Hep C needs to be resolved before treatment per the [facility] all information printed and left for Dr Jorja Loa.
